# Patient Record
Sex: Male | Born: 1970 | Race: White | Hispanic: No | Marital: Single | Smoking: Current every day smoker
Health system: Southern US, Community
[De-identification: ages and names within clinical notes are randomized; demographics above are authoritative.]

## PROBLEM LIST (undated history)

## (undated) DIAGNOSIS — F419 Anxiety disorder, unspecified: Secondary | ICD-10-CM

## (undated) DIAGNOSIS — R569 Unspecified convulsions: Secondary | ICD-10-CM

## (undated) DIAGNOSIS — T7840XA Allergy, unspecified, initial encounter: Secondary | ICD-10-CM

## (undated) DIAGNOSIS — F1911 Other psychoactive substance abuse, in remission: Secondary | ICD-10-CM

## (undated) DIAGNOSIS — K219 Gastro-esophageal reflux disease without esophagitis: Secondary | ICD-10-CM

## (undated) DIAGNOSIS — Z8719 Personal history of other diseases of the digestive system: Secondary | ICD-10-CM

## (undated) DIAGNOSIS — B1920 Unspecified viral hepatitis C without hepatic coma: Secondary | ICD-10-CM

## (undated) DIAGNOSIS — J189 Pneumonia, unspecified organism: Secondary | ICD-10-CM

## (undated) DIAGNOSIS — C801 Malignant (primary) neoplasm, unspecified: Secondary | ICD-10-CM

## (undated) DIAGNOSIS — F1011 Alcohol abuse, in remission: Secondary | ICD-10-CM

## (undated) DIAGNOSIS — A749 Chlamydial infection, unspecified: Secondary | ICD-10-CM

## (undated) HISTORY — PX: HEMORRHOID SURGERY: SHX153

## (undated) HISTORY — DX: Other psychoactive substance abuse, in remission: F19.11

## (undated) HISTORY — PX: OTHER SURGICAL HISTORY: SHX169

## (undated) HISTORY — PX: JOINT REPLACEMENT: SHX530

## (undated) HISTORY — DX: Chlamydial infection, unspecified: A74.9

## (undated) HISTORY — DX: Unspecified viral hepatitis C without hepatic coma: B19.20

## (undated) HISTORY — DX: Allergy, unspecified, initial encounter: T78.40XA

## (undated) HISTORY — DX: Personal history of other diseases of the digestive system: Z87.19

## (undated) HISTORY — DX: Alcohol abuse, in remission: F10.11

---

## 2006-11-10 DIAGNOSIS — R569 Unspecified convulsions: Secondary | ICD-10-CM

## 2006-11-10 HISTORY — DX: Unspecified convulsions: R56.9

## 2010-11-10 HISTORY — PX: ANKLE ARTHROSCOPY W/ OPEN REPAIR: SHX1145

## 2014-02-06 ENCOUNTER — Ambulatory Visit: Payer: Self-pay | Admitting: Adult Health

## 2014-02-06 ENCOUNTER — Encounter: Payer: Self-pay | Admitting: Adult Health

## 2014-02-06 ENCOUNTER — Ambulatory Visit (INDEPENDENT_AMBULATORY_CARE_PROVIDER_SITE_OTHER): Payer: Self-pay | Admitting: Adult Health

## 2014-02-06 VITALS — BP 100/72 | HR 78 | Temp 97.9°F | Resp 14 | Ht 71.0 in | Wt 198.0 lb

## 2014-02-06 DIAGNOSIS — Z113 Encounter for screening for infections with a predominantly sexual mode of transmission: Secondary | ICD-10-CM | POA: Insufficient documentation

## 2014-02-06 DIAGNOSIS — F1011 Alcohol abuse, in remission: Secondary | ICD-10-CM | POA: Insufficient documentation

## 2014-02-06 LAB — HEPATIC FUNCTION PANEL
ALK PHOS: 52 U/L (ref 39–117)
ALT: 16 U/L (ref 0–53)
AST: 26 U/L (ref 0–37)
Albumin: 4.3 g/dL (ref 3.5–5.2)
Bilirubin, Direct: 0.1 mg/dL (ref 0.0–0.3)
Total Bilirubin: 0.9 mg/dL (ref 0.3–1.2)
Total Protein: 7.4 g/dL (ref 6.0–8.3)

## 2014-02-06 NOTE — Patient Instructions (Signed)
   Thank you for choosing [place] [facility] for your health care needs.  Please have your labs drawn prior to leaving the office.  The results will be available through MyChart for your convenience. Please remember to activate this. The activation code is located at the end of this form.  You will need to schedule an appointment to have a complete physical exam where we will do additional blood work.

## 2014-02-06 NOTE — Progress Notes (Signed)
Pre visit review using our clinic review tool, if applicable. No additional management support is needed unless otherwise documented below in the visit note. 

## 2014-02-06 NOTE — Progress Notes (Signed)
Patient ID: Carl Morton, male   DOB: 1971/07/31, 43 y.o.   MRN: [id number]    Subjective:    Patient ID: Jaquise Morton, male    DOB: [birth year], 43 y.o.   MRN: [id number]  HPI  Pt is a pleasant 43 y/o male who presents to clinic to establish care. He is requesting STD testing. He also reports that he would like testing for Hepatitis C. He was homeless for 13-14 years. He has been in rehab for drugs and alcohol in 2011. He reports being clean since then. Pt reports that he was supposed to have his liver function drawn; however, he forgot to go have this drawn. We will check this for him here today.   Past Medical History  Diagnosis Date  . History of hemorrhoids   . Allergy   . Chlamydia   . Hepatitis C   . History of alcohol abuse   . History of drug abuse      Past Surgical History  Procedure Laterality Date  . Hemorrhoid surgery    . Ankle arthroscopy w/ open repair  2012  . Tear duct reconstruction Bilateral     Adolescent     Family History  Problem Relation Age of Onset  . Heart disease Father     CAD     History   Social History  . Marital Status: Single    Spouse Name: N/A    Number of Children: 0  . Years of Education: 9   Occupational History  . Landscaping    Social History Main Topics  . Smoking status: Current Every Day Smoker -- 0.25 packs/day for 34 years    Types: Cigarettes  . Smokeless tobacco: Not on file     Comment: 34 yrs   . Alcohol Use: No  . Drug Use: No  . Sexual Activity: Not on file   Other Topics Concern  . Not on file   Social History Narrative   Carl Morton grew up in [place]. He dropped out of [employer] in the 10th grade. He was homeless for 13-14 years. He was in rehab for drugs and alcohol in 2011. He has been clean since. He works in Biomedical scientist.      Review of Systems  Constitutional: Negative.   HENT: Negative.   Eyes: Negative.   Respiratory: Negative.   Cardiovascular: Negative.   Gastrointestinal:  Negative.   Endocrine: Negative.   Genitourinary: Negative.   Musculoskeletal: Negative.   Skin: Negative.   Allergic/Immunologic: Negative.   Neurological: Negative.   Hematological: Negative.   Psychiatric/Behavioral: Negative.        Objective:  BP 100/72  Pulse 78  Temp(Src) 97.9 F (36.6 C) (Oral)  Resp 14  Ht 5\' 11"  (1.803 m)  Wt 198 lb (89.812 kg)  BMI 27.63 kg/m2  SpO2 98%   Physical Exam  Constitutional: He is oriented to person, place, and time. He appears well-developed and well-nourished. No distress.  HENT:  Head: Normocephalic and atraumatic.  Eyes: Conjunctivae and EOM are normal.  Neck: Normal range of motion. Neck supple. No tracheal deviation present.  Cardiovascular: Normal rate, regular rhythm, normal heart sounds and intact distal pulses.  Exam reveals no gallop and no friction rub.   No murmur heard. Pulmonary/Chest: Effort normal and breath sounds normal. No respiratory distress. He has no wheezes. He has no rales.  Abdominal: Soft. Bowel sounds are normal. He exhibits no distension and no mass. There is no tenderness. There is no rebound and  no guarding.  Musculoskeletal: Normal range of motion.  Neurological: He is alert and oriented to person, place, and time. He has normal reflexes. No cranial nerve deficit. Coordination normal.  Skin: Skin is warm and dry.  Psychiatric: He has a normal mood and affect. His behavior is normal. Judgment and thought content normal.       Assessment & Plan:   1. Screen for STD (sexually transmitted disease) Pt has a hx of being homeless for 13-14 years, ETOH, drug abuse. Rehab in 2011 and clean since. Requesting STD testing. - GC/chlamydia probe amp, urine - RPR - HIV antibody - HSV(herpes simplex vrs) 1+2 ab-IgG - Hepatitis B surface antigen - Hepatitis B surface antibody - Hepatitis B core antibody, total - Hepatitis C antibody - Hepatitis A antibody, IgM  2. H/O ETOH abuse Will check hepatic panel  since hx of ETOH for many years. - Hepatic function panel

## 2014-02-07 ENCOUNTER — Other Ambulatory Visit: Payer: Self-pay | Admitting: Adult Health

## 2014-02-07 DIAGNOSIS — R768 Other specified abnormal immunological findings in serum: Secondary | ICD-10-CM

## 2014-02-07 LAB — HEPATITIS B CORE ANTIBODY, TOTAL: Hep B Core Total Ab: NONREACTIVE

## 2014-02-07 LAB — HEPATITIS B SURFACE ANTIBODY,QUALITATIVE: HEP B S AB: NEGATIVE

## 2014-02-07 LAB — HSV(HERPES SIMPLEX VRS) I + II AB-IGG
HSV 1 GLYCOPROTEIN G AB, IGG: 9 IV — AB
HSV 2 Glycoprotein G Ab, IgG: 0.32 IV

## 2014-02-07 LAB — HEPATITIS A ANTIBODY, IGM: HEP A IGM: NONREACTIVE

## 2014-02-07 LAB — HIV ANTIBODY (ROUTINE TESTING W REFLEX): HIV: NONREACTIVE

## 2014-02-07 LAB — GC/CHLAMYDIA PROBE AMP, URINE
Chlamydia, Swab/Urine, PCR: NEGATIVE
GC Probe Amp, Urine: NEGATIVE

## 2014-02-07 LAB — RPR

## 2014-02-07 LAB — HEPATITIS C ANTIBODY: HCV Ab: REACTIVE — AB

## 2014-02-07 LAB — HEPATITIS B SURFACE ANTIGEN: Hepatitis B Surface Ag: NEGATIVE

## 2014-12-21 ENCOUNTER — Ambulatory Visit: Payer: Self-pay

## 2015-09-03 ENCOUNTER — Emergency Department: Payer: Self-pay

## 2015-09-03 ENCOUNTER — Emergency Department
Admission: EM | Admit: 2015-09-03 | Discharge: 2015-09-03 | Disposition: A | Discharge status: Home / Self Care | Payer: Self-pay | Attending: Internal Medicine | Admitting: Internal Medicine

## 2015-09-03 ENCOUNTER — Encounter: Payer: Self-pay | Admitting: Emergency Medicine

## 2015-09-03 ENCOUNTER — Other Ambulatory Visit: Payer: Self-pay

## 2015-09-03 DIAGNOSIS — R0602 Shortness of breath: Secondary | ICD-10-CM | POA: Insufficient documentation

## 2015-09-03 DIAGNOSIS — R079 Chest pain, unspecified: Secondary | ICD-10-CM | POA: Insufficient documentation

## 2015-09-03 DIAGNOSIS — R61 Generalized hyperhidrosis: Secondary | ICD-10-CM | POA: Insufficient documentation

## 2015-09-03 DIAGNOSIS — Z720 Tobacco use: Secondary | ICD-10-CM | POA: Insufficient documentation

## 2015-09-03 DIAGNOSIS — R112 Nausea with vomiting, unspecified: Secondary | ICD-10-CM | POA: Insufficient documentation

## 2015-09-03 LAB — COMPREHENSIVE METABOLIC PANEL
ALT: 19 U/L (ref 17–63)
AST: 20 U/L (ref 15–41)
Albumin: 4.3 g/dL (ref 3.5–5.0)
Alkaline Phosphatase: 44 U/L (ref 38–126)
Anion gap: 9 (ref 5–15)
BILIRUBIN TOTAL: 1.1 mg/dL (ref 0.3–1.2)
BUN: 15 mg/dL (ref 6–20)
CO2: 28 mmol/L (ref 22–32)
Calcium: 9.6 mg/dL (ref 8.9–10.3)
Chloride: 103 mmol/L (ref 101–111)
Creatinine, Ser: 1.2 mg/dL (ref 0.61–1.24)
GFR calc Af Amer: >60 mL/min (ref >60)
GFR calc non Af Amer: >60 mL/min (ref >60)
GLUCOSE: 114 mg/dL — AB (ref 65–99)
POTASSIUM: 3.5 mmol/L (ref 3.5–5.1)
Sodium: 140 mmol/L (ref 135–145)
TOTAL PROTEIN: 7.8 g/dL (ref 6.5–8.1)

## 2015-09-03 LAB — CBC
HCT: 48.6 % (ref 40.0–52.0)
Hemoglobin: 16.6 g/dL (ref 13.0–18.0)
MCH: 32.1 pg (ref 26.0–34.0)
MCHC: 34 g/dL (ref 32.0–36.0)
MCV: 94.3 fL (ref 80.0–100.0)
PLATELETS: 222 10*3/uL (ref 150–440)
RBC: 5.16 MIL/uL (ref 4.40–5.90)
RDW: 12.7 % (ref 11.5–14.5)
WBC: 7 10*3/uL (ref 3.8–10.6)

## 2015-09-03 LAB — TROPONIN I: Troponin I: <0.03 ng/mL (ref <0.031)

## 2015-09-03 NOTE — ED Notes (Signed)
Patient from home via [facility] with c/o right sided chest pain since 1030am with nausea, vomiting, shortness of breath, and diaphoresis. Patient states under exertion his heart feels like it will "beat plum out of my chest".

## 2015-09-03 NOTE — ED Provider Notes (Signed)
[facility] Emergency Department Provider Note     Time seen: ----------------------------------------- 11:56 AM on 09/03/2015 -----------------------------------------    I have reviewed the triage vital signs and the nursing notes.   HISTORY  Chief Complaint No chief complaint on file.    HPI Carl Morton is a 44 y.o. male who presents to ER with right-sided chest pain since 10:30 AM with nausea vomiting shortness of breath and diaphoresis. Patient states under exertion his heart feels like it will be out of his chest and he feels it radiate into his head. Patient denies a history of this before. Nothing makes his symptoms better.   Past Medical History  Diagnosis Date  . History of hemorrhoids   . Allergy   . Chlamydia   . Hepatitis C   . History of alcohol abuse   . History of drug abuse     Patient Active Problem List   Diagnosis Date Noted  . Screen for STD (sexually transmitted disease) 02/06/2014  . H/O ETOH abuse 02/06/2014    Past Surgical History  Procedure Laterality Date  . Hemorrhoid surgery    . Ankle arthroscopy w/ open repair  2012  . Tear duct reconstruction Bilateral     Adolescent    Allergies Haldol  Social History Social History  Substance Use Topics  . Smoking status: Current Every Day Smoker -- 0.25 packs/day for 34 years    Types: Cigarettes  . Smokeless tobacco: Not on file     Comment: 34 yrs   . Alcohol Use: No    Review of Systems Constitutional: Negative for fever. Eyes: Negative for visual changes. ENT: Negative for sore throat. Cardiovascular: Positive for chest pain Respiratory: Positive shortness of breath Gastrointestinal: Negative for abdominal pain, positive for nausea vomiting Genitourinary: Negative for dysuria. Musculoskeletal: Negative for back pain. Skin: Positive for sweats Neurological: Negative for headaches, focal weakness or numbness.  10-point ROS otherwise  negative.  ____________________________________________   PHYSICAL EXAM:  VITAL SIGNS: ED Triage Vitals  Enc Vitals Group     BP --      Pulse --      Resp --      Temp --      Temp src --      SpO2 --      Weight --      Height --      Head Cir --      Peak Flow --      Pain Score --      Pain Loc --      Pain Edu? --      Excl. in [place]? --     Constitutional: Alert and oriented. Well appearing and in no distress. Eyes: Conjunctivae are normal. PERRL. Normal extraocular movements. ENT   Head: Normocephalic and atraumatic.   Nose: No congestion/rhinnorhea.   Mouth/Throat: Mucous membranes are moist.   Neck: No stridor. Cardiovascular: Normal rate, regular rhythm. Normal and symmetric distal pulses are present in all extremities. No murmurs, rubs, or gallops. Respiratory: Normal respiratory effort without tachypnea nor retractions. Breath sounds are clear and equal bilaterally. No wheezes/rales/rhonchi. Gastrointestinal: Soft and nontender. No distention. No abdominal bruits.  Musculoskeletal: Nontender with normal range of motion in all extremities. No joint effusions.  No lower extremity tenderness nor edema. Neurologic:  Normal speech and language. No gross focal neurologic deficits are appreciated. Speech is normal. No gait instability. Skin:  Skin is warm, dry and intact. No rash noted. Psychiatric: Mood and  affect are normal. Speech and behavior are normal. Patient exhibits appropriate insight and judgment. ____________________________________________  EKG: Interpreted by me. Normal sinus rhythm with a rate of 79 bpm, normal PR interval, normal QRS with, normal QT interval.  ____________________________________________  ED COURSE:  Pertinent labs & imaging results that were available during my care of the patient were reviewed by me and considered in my medical decision making (see chart for details). Patient is in no acute distress, will check labs and  reevaluate. ____________________________________________    LABS (pertinent positives/negatives)  Labs Reviewed  COMPREHENSIVE METABOLIC PANEL - Abnormal; Notable for the following:    Glucose, Bld 114 (*)    All other components within normal limits  CBC  TROPONIN I  TROPONIN I  URINE DRUG SCREEN, QUALITATIVE ([facility] ONLY)    RADIOLOGY Chest x-ray Is unremarkable ____________________________________________  FINAL ASSESSMENT AND PLAN  Chest pain  Plan: Patient with labs and imaging as dictated above. No clear cause for her symptoms. Patient is low risk according to heart score. Will be referred to cardiology for outpatient follow-up and likely stress testing or Holter monitoring.   Earleen Newport, MD   Earleen Newport, MD 09/03/15 [phone]

## 2015-09-03 NOTE — Discharge Instructions (Signed)
Nonspecific Chest Pain  °Chest pain can be caused by many different conditions. There is always a chance that your pain could be related to something serious, such as a heart attack or a blood clot in your lungs. Chest pain can also be caused by conditions that are not life-threatening. If you have chest pain, it is very important to follow up with your health care provider. °CAUSES  °Chest pain can be caused by: °· Heartburn. °· Pneumonia or bronchitis. °· Anxiety or stress. °· Inflammation around your heart (pericarditis) or lung (pleuritis or pleurisy). °· A blood clot in your lung. °· A collapsed lung (pneumothorax). It can develop suddenly on its own (spontaneous pneumothorax) or from trauma to the chest. °· Shingles infection (varicella-zoster virus). °· Heart attack. °· Damage to the bones, muscles, and cartilage that make up your chest wall. This can include: °¨ Bruised bones due to injury. °¨ Strained muscles or cartilage due to frequent or repeated coughing or overwork. °¨ Fracture to one or more ribs. °¨ Sore cartilage due to inflammation (costochondritis). °RISK FACTORS  °Risk factors for chest pain may include: °· Activities that increase your risk for trauma or injury to your chest. °· Respiratory infections or conditions that cause frequent coughing. °· Medical conditions or overeating that can cause heartburn. °· Heart disease or family history of heart disease. °· Conditions or health behaviors that increase your risk of developing a blood clot. °· Having had chicken pox (varicella zoster). °SIGNS AND SYMPTOMS °Chest pain can feel like: °· Burning or tingling on the surface of your chest or deep in your chest. °· Crushing, pressure, aching, or squeezing pain. °· Dull or sharp pain that is worse when you move, cough, or take a deep breath. °· Pain that is also felt in your back, neck, shoulder, or arm, or pain that spreads to any of these areas. °Your chest pain may come and go, or it may stay  constant. °DIAGNOSIS °Lab tests or other studies may be needed to find the cause of your pain. Your health care provider may have you take a test called an ambulatory ECG (electrocardiogram). An ECG records your heartbeat patterns at the time the test is performed. You may also have other tests, such as: °· Transthoracic echocardiogram (TTE). During echocardiography, sound waves are used to create a picture of all of the heart structures and to look at how blood flows through your heart. °· Transesophageal echocardiogram (TEE). This is a more advanced imaging test that obtains images from inside your body. It allows your health care provider to see your heart in finer detail. °· Cardiac monitoring. This allows your health care provider to monitor your heart rate and rhythm in real time. °· Holter monitor. This is a portable device that records your heartbeat and can help to diagnose abnormal heartbeats. It allows your health care provider to track your heart activity for several days, if needed. °· Stress tests. These can be done through exercise or by taking medicine that makes your heart beat more quickly. °· Blood tests. °· Imaging tests. °TREATMENT  °Your treatment depends on what is causing your chest pain. Treatment may include: °· Medicines. These may include: °¨ Acid blockers for heartburn. °¨ Anti-inflammatory medicine. °¨ Pain medicine for inflammatory conditions. °¨ Antibiotic medicine, if an infection is present. °¨ Medicines to dissolve blood clots. °¨ Medicines to treat coronary artery disease. °· Supportive care for conditions that do not require medicines. This may include: °¨ Resting. °¨ Applying heat   or cold packs to injured areas. °¨ Limiting activities until pain decreases. °HOME CARE INSTRUCTIONS °· If you were prescribed an antibiotic medicine, finish it all even if you start to feel better. °· Avoid any activities that bring on chest pain. °· Do not use any tobacco products, including  cigarettes, chewing tobacco, or electronic cigarettes. If you need help quitting, ask your health care provider. °· Do not drink alcohol. °· Take medicines only as directed by your health care provider. °· Keep all follow-up visits as directed by your health care provider. This is important. This includes any further testing if your chest pain does not go away. °· If heartburn is the cause for your chest pain, you may be told to keep your head raised (elevated) while sleeping. This reduces the chance that acid will go from your stomach into your esophagus. °· Make lifestyle changes as directed by your health care provider. These may include: °¨ Getting regular exercise. Ask your health care provider to suggest some activities that are safe for you. °¨ Eating a heart-healthy diet. A registered dietitian can help you to learn healthy eating options. °¨ Maintaining a healthy weight. °¨ Managing diabetes, if necessary. °¨ Reducing stress. °SEEK MEDICAL CARE IF: °· Your chest pain does not go away after treatment. °· You have a rash with blisters on your chest. °· You have a fever. °SEEK IMMEDIATE MEDICAL CARE IF:  °· Your chest pain is worse. °· You have an increasing cough, or you cough up blood. °· You have severe abdominal pain. °· You have severe weakness. °· You faint. °· You have chills. °· You have sudden, unexplained chest discomfort. °· You have sudden, unexplained discomfort in your arms, back, neck, or jaw. °· You have shortness of breath at any time. °· You suddenly start to sweat, or your skin gets clammy. °· You feel nauseous or you vomit. °· You suddenly feel light-headed or dizzy. °· Your heart begins to beat quickly, or it feels like it is skipping beats. °These symptoms may represent a serious problem that is an emergency. Do not wait to see if the symptoms will go away. Get medical help right away. Call your local emergency services (911 in the [place] Do not drive yourself to the hospital. °  °This  information is not intended to replace advice given to you by your health care provider. Make sure you discuss any questions you have with your health care provider. °  °Document Released: 08/06/2005 Document Revised: 11/17/2014 Document Reviewed: 06/02/2014 °Elsevier Interactive Patient Education ©2016 [employer] ° °

## 2018-01-04 ENCOUNTER — Other Ambulatory Visit: Payer: Self-pay | Admitting: Student

## 2018-01-04 DIAGNOSIS — R1013 Epigastric pain: Secondary | ICD-10-CM

## 2018-01-04 DIAGNOSIS — R1310 Dysphagia, unspecified: Secondary | ICD-10-CM

## 2018-01-12 ENCOUNTER — Ambulatory Visit
Admission: RE | Admit: 2018-01-12 | Discharge: 2018-01-12 | Disposition: A | Discharge status: Home / Self Care | Payer: Medicaid Other | Source: Ambulatory Visit | Attending: Student | Admitting: Student

## 2018-01-12 DIAGNOSIS — K219 Gastro-esophageal reflux disease without esophagitis: Secondary | ICD-10-CM | POA: Insufficient documentation

## 2018-01-12 DIAGNOSIS — R1310 Dysphagia, unspecified: Secondary | ICD-10-CM | POA: Diagnosis present

## 2018-01-12 DIAGNOSIS — R1013 Epigastric pain: Secondary | ICD-10-CM

## 2018-09-02 ENCOUNTER — Inpatient Hospital Stay: Admission: RE | Admit: 2018-09-02 | Payer: Medicaid Other | Source: Ambulatory Visit

## 2018-09-02 HISTORY — DX: Gastro-esophageal reflux disease without esophagitis: K21.9

## 2018-09-03 ENCOUNTER — Other Ambulatory Visit: Payer: Self-pay

## 2018-09-03 ENCOUNTER — Encounter
Admission: RE | Admit: 2018-09-03 | Discharge: 2018-09-03 | Disposition: A | Discharge status: Home / Self Care | Payer: Medicaid Other | Source: Ambulatory Visit | Attending: Otolaryngology | Admitting: Otolaryngology

## 2018-09-03 HISTORY — DX: Pneumonia, unspecified organism: J18.9

## 2018-09-03 HISTORY — DX: Anxiety disorder, unspecified: F41.9

## 2018-09-03 HISTORY — DX: Unspecified convulsions: R56.9

## 2018-09-03 NOTE — Patient Instructions (Signed)
Your procedure is scheduled on: 09-09-18 THURSDAY Report to Same Day Surgery 2nd floor medical mall [facility] Entrance-take elevator on left to 2nd floor.  Check in with surgery information desk.) To find out your arrival time please call [phone] between 1PM - 3PM on 09-08-18 [facility]  Remember: Instructions that are not followed completely may result in serious medical risk, up to and including death, or upon the discretion of your surgeon and anesthesiologist your surgery may need to be rescheduled.    _x___ 1. Do not eat food after midnight the night before your procedure. NO GUM OR CANDY AFTER MIDNIGHT.  You may drink clear liquids up to 2 hours before you are scheduled to arrive at the hospital for your procedure.  Do not drink clear liquids within 2 hours of your scheduled arrival to the hospital.  Clear liquids include  --Water or Apple juice without pulp  --Clear carbohydrate beverage such as ClearFast or Gatorade  --Black Coffee or Clear Tea (No milk, no creamers, do not add anything to the coffee or Tea   ____Ensure clear carbohydrate drink on the way to the hospital for bariatric patients  ____Ensure clear carbohydrate drink 3 hours before surgery for Dr Dwyane Luo patients if physician instructed.    __x__ 2. No Alcohol for 24 hours before or after surgery.   __x__3. No Smoking or e-cigarettes for 24 prior to surgery.  Do not use any chewable tobacco products for at least 6 hour prior to surgery   ____  4. Bring all medications with you on the day of surgery if instructed.    __x__ 5. Notify your doctor if there is any change in your medical condition     (cold, fever, infections).    x___6. On the morning of surgery brush your teeth with toothpaste and water.  You may rinse your mouth with mouth wash if you wish.  Do not swallow any toothpaste or mouthwash.   Do not wear jewelry, make-up, hairpins, clips or nail polish.  Do not wear lotions, powders, or  perfumes. You may wear deodorant.  Do not shave 48 hours prior to surgery. Men may shave face and neck.  Do not bring valuables to the hospital.    [facility] is not responsible for any belongings or valuables.               Contacts, dentures or bridgework may not be worn into surgery.  Leave your suitcase in the car. After surgery it may be brought to your room.  For patients admitted to the hospital, discharge time is determined by your treatment team.  _  Patients discharged the day of surgery will not be allowed to drive home.  You will need someone to drive you home and stay with you the night of your procedure.    Please read over the following fact sheets that you were given:   [facility] Preparing for Surgery  _x___ TAKE THE FOLLOWING MEDICATION THE MORNING OF SURGERY WITH A SMALL SIP OF WATER. These include:  1. LORATADINE (CLARITIN)  2.  PRILOSEC (OMEPRAZOLE)  3. TAKE AN EXTRA PRILOSEC THE NIGHT BEFORE YOUR SURGERY  4.  5.  6.  ____Fleets enema or Magnesium Citrate as directed.   ____ Use CHG Soap or sage wipes as directed on instruction sheet   ____ Use inhalers on the day of surgery and bring to hospital day of surgery  ____ Stop Metformin and Janumet 2 days prior to surgery.  ____ Take 1/2 of usual insulin dose the night before surgery and none on the morning surgery.   ____ Follow recommendations from Cardiologist, Pulmonologist or PCP regarding stopping Aspirin, Coumadin, Plavix ,Eliquis, Effient, or Pradaxa, and Pletal.  X____Stop Anti-inflammatories such as Advil, Aleve, Ibuprofen, Motrin, Naproxen, Naprosyn, Goodies powders or aspirin products NOW-OK to take Tylenol   _x___ Stop supplements until after surgery-STOP MELATONIN NOW-MAY RESUME AFTER SURGERY   ____ Bring C-Pap to the hospital.

## 2018-09-08 ENCOUNTER — Encounter: Payer: Self-pay | Admitting: Anesthesiology

## 2018-09-08 ENCOUNTER — Encounter
Admission: RE | Admit: 2018-09-08 | Discharge: 2018-09-08 | Disposition: A | Discharge status: Home / Self Care | Payer: Medicaid Other | Source: Ambulatory Visit | Attending: Otolaryngology | Admitting: Otolaryngology

## 2018-09-08 DIAGNOSIS — Z01812 Encounter for preprocedural laboratory examination: Secondary | ICD-10-CM | POA: Diagnosis present

## 2018-09-08 LAB — COMPREHENSIVE METABOLIC PANEL
ALT: 24 U/L (ref 0–44)
ANION GAP: 7 (ref 5–15)
AST: 22 U/L (ref 15–41)
Albumin: 4.1 g/dL (ref 3.5–5.0)
Alkaline Phosphatase: 40 U/L (ref 38–126)
BUN: 18 mg/dL (ref 6–20)
CHLORIDE: 103 mmol/L (ref 98–111)
CO2: 27 mmol/L (ref 22–32)
Calcium: 9.1 mg/dL (ref 8.9–10.3)
Creatinine, Ser: 1.08 mg/dL (ref 0.61–1.24)
GFR calc Af Amer: >60 mL/min (ref >60)
GFR calc non Af Amer: >60 mL/min (ref >60)
GLUCOSE: 97 mg/dL (ref 70–99)
POTASSIUM: 4 mmol/L (ref 3.5–5.1)
Sodium: 137 mmol/L (ref 135–145)
Total Bilirubin: 0.8 mg/dL (ref 0.3–1.2)
Total Protein: 7.3 g/dL (ref 6.5–8.1)

## 2018-09-08 LAB — CBC
HCT: 44.1 % (ref 39.0–52.0)
Hemoglobin: 15.2 g/dL (ref 13.0–17.0)
MCH: 32.5 pg (ref 26.0–34.0)
MCHC: 34.5 g/dL (ref 30.0–36.0)
MCV: 94.2 fL (ref 80.0–100.0)
PLATELETS: 235 10*3/uL (ref 150–400)
RBC: 4.68 MIL/uL (ref 4.22–5.81)
RDW: 12.3 % (ref 11.5–15.5)
WBC: 5.8 10*3/uL (ref 4.0–10.5)
nRBC: 0 % (ref 0.0–0.2)

## 2018-09-08 NOTE — Pre-Procedure Instructions (Signed)
MEDICAL CLEARANCE ON CHART LOW RISK 

## 2018-09-09 ENCOUNTER — Encounter
Admission: RE | Disposition: A | Discharge status: Home / Self Care | Payer: Self-pay | Source: Ambulatory Visit | Attending: Otolaryngology

## 2018-09-09 ENCOUNTER — Encounter: Payer: Self-pay | Admitting: *Deleted

## 2018-09-09 ENCOUNTER — Ambulatory Visit
Admission: RE | Admit: 2018-09-09 | Discharge: 2018-09-09 | Disposition: A | Discharge status: Home / Self Care | Payer: Medicaid Other | Source: Ambulatory Visit | Attending: Otolaryngology | Admitting: Otolaryngology

## 2018-09-09 ENCOUNTER — Ambulatory Visit: Payer: Medicaid Other | Admitting: Anesthesiology

## 2018-09-09 ENCOUNTER — Other Ambulatory Visit: Payer: Self-pay

## 2018-09-09 DIAGNOSIS — Z79899 Other long term (current) drug therapy: Secondary | ICD-10-CM | POA: Insufficient documentation

## 2018-09-09 DIAGNOSIS — C028 Malignant neoplasm of overlapping sites of tongue: Secondary | ICD-10-CM | POA: Insufficient documentation

## 2018-09-09 DIAGNOSIS — N400 Enlarged prostate without lower urinary tract symptoms: Secondary | ICD-10-CM | POA: Diagnosis not present

## 2018-09-09 DIAGNOSIS — F17210 Nicotine dependence, cigarettes, uncomplicated: Secondary | ICD-10-CM | POA: Diagnosis not present

## 2018-09-09 DIAGNOSIS — B1920 Unspecified viral hepatitis C without hepatic coma: Secondary | ICD-10-CM | POA: Insufficient documentation

## 2018-09-09 DIAGNOSIS — J449 Chronic obstructive pulmonary disease, unspecified: Secondary | ICD-10-CM | POA: Insufficient documentation

## 2018-09-09 HISTORY — PX: MICROLARYNGOSCOPY: SHX5208

## 2018-09-09 HISTORY — PX: TONGUE BIOPSY: SHX6575

## 2018-09-09 LAB — URINE DRUG SCREEN, QUALITATIVE (ARMC ONLY)
Amphetamines, Ur Screen: NOT DETECTED
BARBITURATES, UR SCREEN: NOT DETECTED
Benzodiazepine, Ur Scrn: NOT DETECTED
CANNABINOID 50 NG, UR ~~LOC~~: POSITIVE — AB
Cocaine Metabolite,Ur ~~LOC~~: NOT DETECTED
MDMA (ECSTASY) UR SCREEN: NOT DETECTED
Methadone Scn, Ur: NOT DETECTED
Opiate, Ur Screen: NOT DETECTED
Phencyclidine (PCP) Ur S: NOT DETECTED
TRICYCLIC, UR SCREEN: NOT DETECTED

## 2018-09-09 SURGERY — BIOPSY, TONGUE
Anesthesia: General

## 2018-09-09 MED ORDER — ONDANSETRON HCL 4 MG/2ML IJ SOLN
INTRAMUSCULAR | Status: DC | PRN
  Administered 2018-09-09: 4 mg via INTRAVENOUS

## 2018-09-09 MED ORDER — HYDROCODONE-ACETAMINOPHEN 5-325 MG PO TABS
ORAL_TABLET | ORAL | Status: AC
  Filled 2018-09-09: qty 1

## 2018-09-09 MED ORDER — FENTANYL CITRATE (PF) 100 MCG/2ML IJ SOLN
INTRAMUSCULAR | Status: AC
  Filled 2018-09-09: qty 2

## 2018-09-09 MED ORDER — PROPOFOL 10 MG/ML IV BOLUS
INTRAVENOUS | Status: AC
  Filled 2018-09-09: qty 20

## 2018-09-09 MED ORDER — EPHEDRINE SULFATE 50 MG/ML IJ SOLN
INTRAMUSCULAR | Status: AC
  Filled 2018-09-09: qty 1

## 2018-09-09 MED ORDER — ONDANSETRON HCL 4 MG/2ML IJ SOLN
INTRAMUSCULAR | Status: AC
  Filled 2018-09-09: qty 2

## 2018-09-09 MED ORDER — FENTANYL CITRATE (PF) 100 MCG/2ML IJ SOLN
INTRAMUSCULAR | Status: DC | PRN
  Administered 2018-09-09: 50 ug via INTRAVENOUS

## 2018-09-09 MED ORDER — LIDOCAINE HCL (PF) 4 % IJ SOLN
INTRAMUSCULAR | Status: AC
  Filled 2018-09-09: qty 5

## 2018-09-09 MED ORDER — ROCURONIUM BROMIDE 100 MG/10ML IV SOLN
INTRAVENOUS | Status: DC | PRN
  Administered 2018-09-09: 20 mg via INTRAVENOUS

## 2018-09-09 MED ORDER — MIDAZOLAM HCL 2 MG/2ML IJ SOLN
INTRAMUSCULAR | Status: AC
  Filled 2018-09-09: qty 2

## 2018-09-09 MED ORDER — PROPOFOL 10 MG/ML IV BOLUS
INTRAVENOUS | Status: DC | PRN
  Administered 2018-09-09: 50 mg via INTRAVENOUS
  Administered 2018-09-09: 150 mg via INTRAVENOUS

## 2018-09-09 MED ORDER — HYDROCODONE-ACETAMINOPHEN 5-325 MG PO TABS: 1.0000 | ORAL_TABLET | ORAL | 0 refills | Status: DC | PRN

## 2018-09-09 MED ORDER — SUGAMMADEX SODIUM 200 MG/2ML IV SOLN
INTRAVENOUS | Status: DC | PRN
  Administered 2018-09-09: 100 mg via INTRAVENOUS

## 2018-09-09 MED ORDER — LACTATED RINGERS IV SOLN
INTRAVENOUS | Status: DC
  Administered 2018-09-09 (×2): via INTRAVENOUS

## 2018-09-09 MED ORDER — SUCCINYLCHOLINE CHLORIDE 20 MG/ML IJ SOLN
INTRAMUSCULAR | Status: DC | PRN
  Administered 2018-09-09: 80 mg via INTRAVENOUS

## 2018-09-09 MED ORDER — FENTANYL CITRATE (PF) 100 MCG/2ML IJ SOLN
25.0000 ug | INTRAMUSCULAR | Status: DC | PRN
  Administered 2018-09-09 (×2): 25 ug via INTRAVENOUS

## 2018-09-09 MED ORDER — OXYMETAZOLINE HCL 0.05 % NA SOLN
NASAL | Status: AC
  Filled 2018-09-09: qty 15

## 2018-09-09 MED ORDER — LIDOCAINE VISCOUS HCL 2 % MT SOLN: 10.0000 mL | Freq: Four times a day (QID) | OROMUCOSAL | 0 refills | Status: DC | PRN

## 2018-09-09 MED ORDER — DEXAMETHASONE SODIUM PHOSPHATE 10 MG/ML IJ SOLN
INTRAMUSCULAR | Status: DC | PRN
  Administered 2018-09-09: 10 mg via INTRAVENOUS

## 2018-09-09 MED ORDER — ONDANSETRON HCL 4 MG PO TABS: 4.0000 mg | ORAL_TABLET | Freq: Three times a day (TID) | ORAL | 0 refills | Status: DC | PRN

## 2018-09-09 MED ORDER — ONDANSETRON HCL 4 MG/2ML IJ SOLN: 4.0000 mg | Freq: Once | INTRAMUSCULAR | Status: DC | PRN

## 2018-09-09 MED ORDER — HYDROCODONE-ACETAMINOPHEN 5-325 MG PO TABS
1.0000 | ORAL_TABLET | ORAL | Status: DC | PRN
  Administered 2018-09-09: 1 via ORAL

## 2018-09-09 MED ORDER — SUGAMMADEX SODIUM 200 MG/2ML IV SOLN
INTRAVENOUS | Status: AC
  Filled 2018-09-09: qty 2

## 2018-09-09 MED ORDER — LIDOCAINE HCL (CARDIAC) PF 100 MG/5ML IV SOSY
PREFILLED_SYRINGE | INTRAVENOUS | Status: DC | PRN
  Administered 2018-09-09: 100 mg via INTRAVENOUS

## 2018-09-09 MED ORDER — LIDOCAINE-EPINEPHRINE 1 %-1:100000 IJ SOLN
INTRAMUSCULAR | Status: AC
  Filled 2018-09-09: qty 1

## 2018-09-09 SURGICAL SUPPLY — 29 items
BLADE SURG 15 STRL LF DISP TIS (BLADE) ×2 IMPLANT
BLADE SURG 15 STRL SS (BLADE) ×2
CANISTER SUCT 1200ML W/VALVE (MISCELLANEOUS) ×4 IMPLANT
COAGULATOR SUCT 8FR VV (MISCELLANEOUS) ×4 IMPLANT
COVER WAND RF STERILE (DRAPES) IMPLANT
CUP MEDICINE 2OZ PLAST GRAD ST (MISCELLANEOUS) ×4 IMPLANT
DRAPE TABLE BACK 80X90 (DRAPES) ×4 IMPLANT
DRSG TELFA 4X3 1S NADH ST (GAUZE/BANDAGES/DRESSINGS) ×4 IMPLANT
ELECT CAUTERY BLADE 6.4 (BLADE) ×4 IMPLANT
ELECT REM PT RETURN 9FT ADLT (ELECTROSURGICAL) ×4
ELECTRODE REM PT RTRN 9FT ADLT (ELECTROSURGICAL) ×2 IMPLANT
GAUZE 4X4 16PLY RFD (DISPOSABLE) ×4 IMPLANT
GLOVE BIO SURGEON STRL SZ7.5 (GLOVE) ×8 IMPLANT
GOWN STRL REUS W/ TWL LRG LVL3 (GOWN DISPOSABLE) ×4 IMPLANT
GOWN STRL REUS W/TWL LRG LVL3 (GOWN DISPOSABLE) ×4
KIT TURNOVER KIT A (KITS) ×4 IMPLANT
NDL SAFETY ECLIPSE 18X1.5 (NEEDLE) ×2 IMPLANT
NEEDLE HYPO 18GX1.5 SHARP (NEEDLE) ×2
NS IRRIG 500ML POUR BTL (IV SOLUTION) ×4 IMPLANT
PACK HEAD/NECK (MISCELLANEOUS) ×4 IMPLANT
PATTIES SURGICAL .5 X.5 (GAUZE/BANDAGES/DRESSINGS) ×4 IMPLANT
SOL ANTI-FOG 6CC FOG-OUT (MISCELLANEOUS) ×2 IMPLANT
SOL FOG-OUT ANTI-FOG 6CC (MISCELLANEOUS) ×2
SUCTION FRAZIER HANDLE 10FR (MISCELLANEOUS) ×2
SUCTION TUBE FRAZIER 10FR DISP (MISCELLANEOUS) ×2 IMPLANT
SYR 3ML LL SCALE MARK (SYRINGE) ×4 IMPLANT
TOWEL OR 17X26 4PK STRL BLUE (TOWEL DISPOSABLE) ×4 IMPLANT
TUBING CONNECTING 10 (TUBING) ×3 IMPLANT
TUBING CONNECTING 10' (TUBING) ×1

## 2018-09-09 NOTE — Anesthesia Postprocedure Evaluation (Signed)
Anesthesia Post Note  Patient: Carl Morton  Procedure(s) Performed: TONGUE BIOPSY (Left ) MICROLARYNGOSCOPY (N/A )  Patient location during evaluation: PACU Anesthesia Type: General Level of consciousness: awake and alert Pain management: pain level controlled Vital Signs Assessment: post-procedure vital signs reviewed and stable Respiratory status: spontaneous breathing, nonlabored ventilation, respiratory function stable and patient connected to nasal cannula oxygen Cardiovascular status: blood pressure returned to baseline and stable Postop Assessment: no apparent nausea or vomiting Anesthetic complications: no     Last Vitals:  Vitals:   09/09/18 1100 09/09/18 1123  BP: 136/86 (!) 142/74  Pulse: 71 78  Resp: 14 16  Temp: (!) 36.4 C   SpO2: 100% 100%    Last Pain:  Vitals:   09/09/18 1123  TempSrc:   PainSc: [place]

## 2018-09-09 NOTE — Discharge Instructions (Signed)

## 2018-09-09 NOTE — Anesthesia Post-op Follow-up Note (Cosign Needed)
Anesthesia QCDR form completed.        

## 2018-09-09 NOTE — Transfer of Care (Cosign Needed)
Immediate Anesthesia Transfer of Care Note  Patient: Carl Morton  Procedure(s) Performed: TONGUE BIOPSY (Left ) MICROLARYNGOSCOPY (N/A )  Patient Location: PACU  Anesthesia Type:General  Level of Consciousness: awake  Airway & Oxygen Therapy: Patient Spontanous Breathing  Post-op Assessment: Report given to RN  Post vital signs: stable  Last Vitals:  Vitals Value Taken Time  BP 123/83 09/09/2018 10:25 AM  Temp    Pulse 84 09/09/2018 10:28 AM  Resp 18 09/09/2018 10:28 AM  SpO2 98 % 09/09/2018 10:28 AM  Vitals shown include unvalidated device data.  Last Pain:  Vitals:   09/09/18 0807  TempSrc: Oral  PainSc: 7          Complications: No apparent anesthesia complications

## 2018-09-09 NOTE — Anesthesia Preprocedure Evaluation (Signed)
Anesthesia Evaluation  Patient identified by MRN, date of birth, ID band Patient awake    Reviewed: Allergy & Precautions, NPO status , Patient's Chart, lab work & pertinent test results, reviewed documented beta blocker date and time   Airway Mallampati: III  TM Distance: >3 FB     Dental  (+) Upper Dentures, Lower Dentures   Pulmonary pneumonia, resolved, Current Smoker,           Cardiovascular      Neuro/Psych Seizures -,  Anxiety    GI/Hepatic GERD  Controlled,(+) Hepatitis -, C  Endo/Other    Renal/GU      Musculoskeletal   Abdominal   Peds  Hematology   Anesthesia Other Findings Seizures during drinking days. ETOH hx. cxr OK. ekg OK from 3 yrs ago.  Reproductive/Obstetrics                             Anesthesia Physical Anesthesia Plan  ASA: III  Anesthesia Plan: General   Post-op Pain Management:    Induction: Intravenous  PONV Risk Score and Plan:   Airway Management Planned: Oral ETT  Additional Equipment:   Intra-op Plan:   Post-operative Plan:   Informed Consent: I have reviewed the patients History and Physical, chart, labs and discussed the procedure including the risks, benefits and alternatives for the proposed anesthesia with the patient or authorized representative who has indicated his/her understanding and acceptance.     Plan Discussed with: CRNA  Anesthesia Plan Comments:         Anesthesia Quick Evaluation

## 2018-09-09 NOTE — H&P (Signed)
..  History and Physical paper copy reviewed and updated date of procedure and will be scanned into system.  Patient seen and examined.  

## 2018-09-09 NOTE — Anesthesia Procedure Notes (Signed)
Procedure Name: Intubation Date/Time: 09/09/2018 9:36 AM Performed by: Gunnar Bulla, MD Pre-anesthesia Checklist: Patient identified, Emergency Drugs available, Suction available, Patient being monitored and Timeout performed Preoxygenation: Pre-oxygenation with 100% oxygen Induction Type: IV induction Ventilation: Mask ventilation without difficulty Laryngoscope Size: Mac and 4 Grade View: Grade I Tube type: Oral Tube size: 6.5 mm Number of attempts: 1 Airway Equipment and Method: Stylet Placement Confirmation: ETT inserted through vocal cords under direct vision,  positive ETCO2 and breath sounds checked- equal and bilateral Secured at: 21 cm Tube secured with: Tape

## 2018-09-09 NOTE — Op Note (Signed)
....  09/09/2018  10:10 AM    Carl Morton  [id number]   Pre-Op Dx:  Left mid tongue mass  Post-op Dx: same  Proc: 1)  Biopsy of left mid tongue mass  2)  Microlaryngoscopy   Surg:  [facility]  Anes:  GOT  EBL:  <82ml  Comp:  none  Findings:  2.5cm left tongue mass at lateral aspect at junction of anterior and posterior tongue.  Firm and ulcerative with multiple biopsies obtained.  No additional findings in patient's oropharynx/pharynx/larynx.  Procedure: After the patient was identified in holding and the history and physical and consent was reviewed, the patient was taken to the operating room and placed in a supine position. General endotracheal anesthesia was induced in the normal fashion.  At this time, the patient was rotated 90 degrees and a shoulder roll was placed as well as well as a gauze as the patient is edentulous   At this time,Dedo laryngoscope was inserted into the patient's oral cavity. Visualization of the oral cavity, oropharynx, pharynx and larynx was made.  This demonstrated a 2.5cm firm and ulcerative mass on the lateral aspect of the patient's left tongue at the junction of the anterior 2/3 and posterior 1/3. Topical afrin on pledgets were placed against the lesion for approximately one minute. At this time, multiple cup forcep biopsies were taken of the tongue mass.  Hemostasis was achieved with Bovie suction cautery.  No additional masses were seen.  The patient's entire airway was next evaluated for hemostasis and meticulous suctioning was performed.  The gauze and laryngoscope was removed from the patient's oral cavity without injury to teeth, lips, or gums.   Care of the patient was transferred to anesthesia.  Dispo:   To PACU in good condition  Plan:  Soft Diet, follow up in 1 week for repeat evaluation and review of pathology.  [name]  09/09/2018 10:10 AM

## 2018-09-10 LAB — SURGICAL PATHOLOGY

## 2018-09-13 NOTE — Addendum Note (Signed)
Addendum  created 09/13/18 1543 by Doreen Salvage, CRNA   Charge Capture section accepted

## 2018-09-14 ENCOUNTER — Other Ambulatory Visit: Payer: Self-pay | Admitting: Otolaryngology

## 2018-09-14 DIAGNOSIS — C029 Malignant neoplasm of tongue, unspecified: Secondary | ICD-10-CM

## 2018-09-24 ENCOUNTER — Ambulatory Visit: Payer: Medicaid Other

## 2018-09-30 ENCOUNTER — Ambulatory Visit
Admission: RE | Admit: 2018-09-30 | Discharge: 2018-09-30 | Disposition: A | Discharge status: Home / Self Care | Payer: Medicaid Other | Source: Ambulatory Visit | Attending: Otolaryngology | Admitting: Otolaryngology

## 2018-09-30 DIAGNOSIS — C029 Malignant neoplasm of tongue, unspecified: Secondary | ICD-10-CM | POA: Insufficient documentation

## 2018-09-30 HISTORY — DX: Malignant (primary) neoplasm, unspecified: C80.1

## 2018-09-30 MED ORDER — IOHEXOL 300 MG/ML  SOLN
75.0000 mL | Freq: Once | INTRAMUSCULAR | Status: AC | PRN
  Administered 2018-09-30: 75 mL via INTRAVENOUS

## 2019-01-06 ENCOUNTER — Encounter (INDEPENDENT_AMBULATORY_CARE_PROVIDER_SITE_OTHER): Payer: Self-pay

## 2019-01-06 ENCOUNTER — Ambulatory Visit
Admission: RE | Admit: 2019-01-06 | Discharge: 2019-01-06 | Disposition: A | Discharge status: Home / Self Care | Payer: Medicaid Other | Source: Ambulatory Visit | Attending: Radiation Oncology | Admitting: Radiation Oncology

## 2019-01-06 ENCOUNTER — Encounter: Payer: Self-pay | Admitting: Radiation Oncology

## 2019-01-06 ENCOUNTER — Other Ambulatory Visit: Payer: Self-pay

## 2019-01-06 ENCOUNTER — Other Ambulatory Visit: Payer: Self-pay | Admitting: *Deleted

## 2019-01-06 VITALS — BP 134/87 | HR 69 | Temp 97.4°F | Wt 176.1 lb

## 2019-01-06 DIAGNOSIS — F17210 Nicotine dependence, cigarettes, uncomplicated: Secondary | ICD-10-CM | POA: Insufficient documentation

## 2019-01-06 DIAGNOSIS — Z8781 Personal history of (healed) traumatic fracture: Secondary | ICD-10-CM | POA: Insufficient documentation

## 2019-01-06 DIAGNOSIS — F1011 Alcohol abuse, in remission: Secondary | ICD-10-CM | POA: Diagnosis not present

## 2019-01-06 DIAGNOSIS — Z79899 Other long term (current) drug therapy: Secondary | ICD-10-CM | POA: Insufficient documentation

## 2019-01-06 DIAGNOSIS — B1920 Unspecified viral hepatitis C without hepatic coma: Secondary | ICD-10-CM | POA: Insufficient documentation

## 2019-01-06 DIAGNOSIS — F419 Anxiety disorder, unspecified: Secondary | ICD-10-CM | POA: Diagnosis not present

## 2019-01-06 DIAGNOSIS — R590 Localized enlarged lymph nodes: Secondary | ICD-10-CM | POA: Diagnosis not present

## 2019-01-06 DIAGNOSIS — K219 Gastro-esophageal reflux disease without esophagitis: Secondary | ICD-10-CM | POA: Insufficient documentation

## 2019-01-06 DIAGNOSIS — C109 Malignant neoplasm of oropharynx, unspecified: Secondary | ICD-10-CM | POA: Insufficient documentation

## 2019-01-06 DIAGNOSIS — G40909 Epilepsy, unspecified, not intractable, without status epilepticus: Secondary | ICD-10-CM | POA: Diagnosis not present

## 2019-01-06 NOTE — Consult Note (Signed)
NEW PATIENT EVALUATION  Name: Carl Morton  MRN: [id number]  Date:   01/06/2019     DOB: 24-Jun-1971   This 48 y.o. 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  REFERRING PHYSICIAN: Remi Haggard, FNP  CHIEF COMPLAINT:  Chief Complaint  Patient presents with  . 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   PREVIOUS INVESTIGATIONS:  PET CT scan orderedPathology report reviewed Clinical records reviewed  HPI: patient is a 48 year old male who presented with 2-3 month history of oral pain after undergoing a teeth extraction. Patient has smoking history also history of alcohol abuse although his been sober since 2010. He was seen by ENT underwent a biopsy which was positive for squamous cell carcinoma. He underwent a transoral resection showing invasive moderately differentiated keratinizing squamous cell carcinoma. Deep margin was negative for high-grade dysplasia or carcinoma. One left level III lymph node was positive for a 1.0 cm metastatic focus negative for extracapsular extension. In total 31 lymph nodes were removed. Recommendation was made for adjuvant radiation therapy and he is seen today for evaluation. Patient had no initial PET CT scan as part of his workup. CT scan initially showed a subtle region of enhancement in the left lateral tongue measuring 1.8 cm in greatest dimension. There are asymmetrical and prominent and numerous left-sided subcentimeter cervical lymph nodes which were indeterminate. Patient has been recommended recommend it for postoperative radiation therapy is seen today for evaluation he is doing fairly well he's having no specific head and neck pain or dysphagia at this time.  PLANNED TREATMENT REGIMEN: adjuvant radiation therapy  PAST MEDICAL HISTORY:  has a past  medical history of Allergy, Anxiety, Cancer ([place]), Chlamydia, GERD (gastroesophageal reflux disease), Hepatitis C, History of alcohol abuse, History of drug abuse ([place]), History of hemorrhoids, Pneumonia, and Seizure ([place]) (2008).    PAST SURGICAL HISTORY:  Past Surgical History:  Procedure Laterality Date  . ANKLE ARTHROSCOPY W/ OPEN REPAIR  2012  . HEMORRHOID SURGERY    . JOINT REPLACEMENT     hip  . MICROLARYNGOSCOPY N/A 09/09/2018   Procedure: MICROLARYNGOSCOPY;  Surgeon: Carloyn Manner, MD;  Location: [facility] ORS;  Service: ENT;  Laterality: N/A;  . TEAR DUCT RECONSTRUCTION Bilateral    Adolescent  . TONGUE BIOPSY Left 09/09/2018   Procedure: TONGUE BIOPSY;  Surgeon: Carloyn Manner, MD;  Location: [facility] ORS;  Service: ENT;  Laterality: Left;    FAMILY HISTORY: family history includes Heart disease in his father.  SOCIAL HISTORY:  reports that he has been smoking cigarettes. He has a 8.50 pack-year smoking history. He has never used smokeless tobacco. He reports previous drug use. Drugs: Heroin, "Crack" cocaine, Cocaine, and Marijuana. He reports that he does not drink alcohol.  ALLERGIES: Haldol [haloperidol lactate]  MEDICATIONS:  Current Outpatient Medications  Medication Sig Dispense Refill  . baclofen (LIORESAL) 10 MG tablet Take 10 mg by mouth as needed for muscle spasms.    . diphenhydrAMINE (BENADRYL ALLERGY) 25 MG tablet Take 200 mg by mouth as needed (pt takes 8 25 mg tab prn at bedtime).    Marland Kitchen HYDROcodone-acetaminophen (NORCO) 5-325 MG tablet Take 1 tablet by mouth every 4 (four) hours as needed for moderate pain. 40 tablet 0  . lidocaine (XYLOCAINE) 2 % solution Use as directed 10 mLs in the mouth or  throat every 6 (six) hours as needed for mouth pain (Swish and spit). 250 mL 0  . loratadine (CLARITIN) 10 MG tablet Take 10 mg by mouth every morning.    . Melatonin 10 MG TABS Take 1 tablet by mouth as needed.    . naproxen (NAPROSYN) 500 MG tablet Take 500 mg by mouth  as needed.    Marland Kitchen omeprazole (PRILOSEC) 40 MG capsule Take 40 mg by mouth every morning.    . ondansetron (ZOFRAN) 4 MG tablet Take 1 tablet (4 mg total) by mouth every 8 (eight) hours as needed for up to 10 doses for nausea or vomiting. 20 tablet 0  . sildenafil (REVATIO) 20 MG tablet Take 20 mg by mouth as needed.    . TraZODone HCl 150 MG TB24 Take 1 tablet by mouth as needed.     No current facility-administered medications for this encounter.     ECOG PERFORMANCE STATUS:  0 - Asymptomatic  REVIEW OF SYSTEMS: patient is hepatitis C past history of alcohol abuse Patient denies any weight loss, fatigue, weakness, fever, chills or night sweats. Patient denies any loss of vision, blurred vision. Patient denies any ringing  of the ears or hearing loss. No irregular heartbeat. Patient denies heart murmur or history of fainting. Patient denies any chest pain or pain radiating to her upper extremities. Patient denies any shortness of breath, difficulty breathing at night, cough or hemoptysis. Patient denies any swelling in the lower legs. Patient denies any nausea vomiting, vomiting of blood, or coffee ground material in the vomitus. Patient denies any stomach pain. Patient states has had normal bowel movements no significant constipation or diarrhea. Patient denies any dysuria, hematuria or significant nocturia. Patient denies any problems walking, swelling in the joints or loss of balance. Patient denies any skin changes, loss of hair or loss of weight. Patient denies any excessive worrying or anxiety or significant depression. Patient denies any problems with insomnia. Patient denies excessive thirst, polyuria, polydipsia. Patient denies any swollen glands, patient denies easy bruising or easy bleeding. Patient denies any recent infections, allergies or URI. Patient "s visual fields have not changed significantly in recent time.    PHYSICAL EXAM: BP 134/87   Pulse 69   Temp (!) 97.4 F (36.3 C)   Wt  176 lb 2.4 oz (79.9 kg)   BMI 25.27 kg/m  Oral cavity showssome granulation tissue in the posterior left oropharynx. No other oral mucosal lesions are identified indirect mirror examination shows upper airway clear vallecula and base of tongue appeared normal. 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 No discernible adenopathy in the cervical or supra clavicular region is appreciated.Well-developed well-nourished patient in NAD. HEENT reveals PERLA, EOMI, discs not visualized.  Oral cavity is clear. No oral mucosal lesions are identified. Neck is clear without evidence of cervical or supraclavicular adenopathy. Lungs are clear to A&P. Cardiac examination is essentially unremarkable with regular rate and rhythm without murmur rub or thrill. Abdomen is benign with no organomegaly or masses noted. Motor sensory and DTR levels are equal and symmetric in the upper and lower extremities. Cranial nerves II through XII are grossly intact. Proprioception is intact. No peripheral adenopathy or edema is identified. No motor or sensory levels are noted. Crude visual fields are within normal range.  LABORATORY DATA: pathology reports reviewed    RADIOLOGY RESULTS:CT scan report reviewed PET CT scan ordered   IMPRESSION: pathologic stage TI N1 M0 squamous cell carcinoma the base of tongue status  post transoral resection with positive neck node in 48 year old male  PLAN: at this time of ordered a PET CT scan just to determine if there is any other residual disease in his head and neck region. I would plan on delivering 5000 cGy over 5 weeks to his head and neck region using IM RT treatment planning and delivery. I would choose IM RT treatment planning and delivery to spare critical structures such as a salivary glands spinal cord orbits.Risks and benefits of treatment including possible radiation esophagitis oral mucositis fatigue alteration of blood counts skin reaction all were described in detail to the  patient. He seems to comprehend my treatment plan well. I personally set up and ordered CT simulation after completion of his PET/CT scan which I will use for my treatment planning purposes. Patient and mother both comprehend my treatment plan well.  I would like to take this opportunity to thank you for allowing me to participate in the care of your patient.Noreene Filbert, MD

## 2019-01-10 ENCOUNTER — Ambulatory Visit: Payer: Medicaid Other

## 2019-01-13 ENCOUNTER — Ambulatory Visit: Payer: Medicaid Other

## 2019-01-13 ENCOUNTER — Telehealth: Payer: Self-pay | Admitting: *Deleted

## 2019-01-13 NOTE — Telephone Encounter (Signed)
Patient notified of appointments for PET scan and CT simulation with read back of appointment dates and times.

## 2019-01-18 ENCOUNTER — Ambulatory Visit: Payer: Medicaid Other

## 2019-01-19 ENCOUNTER — Ambulatory Visit: Payer: Medicaid Other

## 2019-01-20 ENCOUNTER — Ambulatory Visit
Admission: RE | Admit: 2019-01-20 | Discharge: 2019-01-20 | Disposition: A | Discharge status: Home / Self Care | Payer: Medicaid Other | Source: Ambulatory Visit | Attending: Radiation Oncology | Admitting: Radiation Oncology

## 2019-01-20 ENCOUNTER — Other Ambulatory Visit: Payer: Self-pay

## 2019-01-20 DIAGNOSIS — C109 Malignant neoplasm of oropharynx, unspecified: Secondary | ICD-10-CM | POA: Insufficient documentation

## 2019-01-20 DIAGNOSIS — I2510 Atherosclerotic heart disease of native coronary artery without angina pectoris: Secondary | ICD-10-CM | POA: Insufficient documentation

## 2019-01-20 DIAGNOSIS — J439 Emphysema, unspecified: Secondary | ICD-10-CM | POA: Insufficient documentation

## 2019-01-20 LAB — GLUCOSE, CAPILLARY: GLUCOSE-CAPILLARY: 89 mg/dL (ref 70–99)

## 2019-01-20 MED ORDER — FLUDEOXYGLUCOSE F - 18 (FDG) INJECTION
9.1000 | Freq: Once | INTRAVENOUS | Status: AC | PRN
  Administered 2019-01-20: 9.3 via INTRAVENOUS

## 2019-01-24 ENCOUNTER — Ambulatory Visit
Admission: RE | Admit: 2019-01-24 | Discharge: 2019-01-24 | Disposition: A | Discharge status: Home / Self Care | Payer: Medicaid Other | Source: Ambulatory Visit | Attending: Radiation Oncology | Admitting: Radiation Oncology

## 2019-01-24 ENCOUNTER — Other Ambulatory Visit: Payer: Self-pay

## 2019-01-24 DIAGNOSIS — Z79899 Other long term (current) drug therapy: Secondary | ICD-10-CM | POA: Diagnosis not present

## 2019-01-24 DIAGNOSIS — F1011 Alcohol abuse, in remission: Secondary | ICD-10-CM | POA: Insufficient documentation

## 2019-01-24 DIAGNOSIS — Z87891 Personal history of nicotine dependence: Secondary | ICD-10-CM | POA: Insufficient documentation

## 2019-01-24 DIAGNOSIS — K219 Gastro-esophageal reflux disease without esophagitis: Secondary | ICD-10-CM | POA: Insufficient documentation

## 2019-01-24 DIAGNOSIS — C109 Malignant neoplasm of oropharynx, unspecified: Secondary | ICD-10-CM | POA: Diagnosis not present

## 2019-01-24 DIAGNOSIS — Z888 Allergy status to other drugs, medicaments and biological substances status: Secondary | ICD-10-CM | POA: Diagnosis not present

## 2019-01-24 DIAGNOSIS — Z791 Long term (current) use of non-steroidal anti-inflammatories (NSAID): Secondary | ICD-10-CM | POA: Insufficient documentation

## 2019-01-26 DIAGNOSIS — C109 Malignant neoplasm of oropharynx, unspecified: Secondary | ICD-10-CM | POA: Diagnosis not present

## 2019-01-31 ENCOUNTER — Other Ambulatory Visit: Payer: Self-pay | Admitting: *Deleted

## 2019-01-31 DIAGNOSIS — C109 Malignant neoplasm of oropharynx, unspecified: Secondary | ICD-10-CM

## 2019-02-01 ENCOUNTER — Other Ambulatory Visit: Payer: Self-pay

## 2019-02-02 ENCOUNTER — Other Ambulatory Visit: Payer: Self-pay

## 2019-02-02 ENCOUNTER — Ambulatory Visit
Admission: RE | Admit: 2019-02-02 | Discharge: 2019-02-02 | Disposition: A | Discharge status: Home / Self Care | Payer: Medicaid Other | Source: Ambulatory Visit | Attending: Radiation Oncology | Admitting: Radiation Oncology

## 2019-02-03 ENCOUNTER — Other Ambulatory Visit: Payer: Self-pay

## 2019-02-03 ENCOUNTER — Ambulatory Visit
Admission: RE | Admit: 2019-02-03 | Discharge: 2019-02-03 | Disposition: A | Discharge status: Home / Self Care | Payer: Medicaid Other | Source: Ambulatory Visit | Attending: Radiation Oncology | Admitting: Radiation Oncology

## 2019-02-03 DIAGNOSIS — C109 Malignant neoplasm of oropharynx, unspecified: Secondary | ICD-10-CM | POA: Diagnosis not present

## 2019-02-04 ENCOUNTER — Ambulatory Visit
Admission: RE | Admit: 2019-02-04 | Discharge: 2019-02-04 | Disposition: A | Discharge status: Home / Self Care | Payer: Medicaid Other | Source: Ambulatory Visit | Attending: Radiation Oncology | Admitting: Radiation Oncology

## 2019-02-04 ENCOUNTER — Other Ambulatory Visit: Payer: Self-pay

## 2019-02-04 DIAGNOSIS — C109 Malignant neoplasm of oropharynx, unspecified: Secondary | ICD-10-CM | POA: Diagnosis not present

## 2019-02-07 ENCOUNTER — Other Ambulatory Visit: Payer: Self-pay

## 2019-02-07 ENCOUNTER — Ambulatory Visit
Admission: RE | Admit: 2019-02-07 | Discharge: 2019-02-07 | Disposition: A | Discharge status: Home / Self Care | Payer: Medicaid Other | Source: Ambulatory Visit | Attending: Radiation Oncology | Admitting: Radiation Oncology

## 2019-02-07 DIAGNOSIS — C109 Malignant neoplasm of oropharynx, unspecified: Secondary | ICD-10-CM | POA: Diagnosis not present

## 2019-02-08 ENCOUNTER — Other Ambulatory Visit: Payer: Self-pay

## 2019-02-08 ENCOUNTER — Ambulatory Visit
Admission: RE | Admit: 2019-02-08 | Discharge: 2019-02-08 | Disposition: A | Discharge status: Home / Self Care | Payer: Medicaid Other | Source: Ambulatory Visit | Attending: Radiation Oncology | Admitting: Radiation Oncology

## 2019-02-08 DIAGNOSIS — C109 Malignant neoplasm of oropharynx, unspecified: Secondary | ICD-10-CM | POA: Diagnosis not present

## 2019-02-09 ENCOUNTER — Ambulatory Visit
Admission: RE | Admit: 2019-02-09 | Discharge: 2019-02-09 | Disposition: A | Discharge status: Home / Self Care | Payer: Medicaid Other | Source: Ambulatory Visit | Attending: Radiation Oncology | Admitting: Radiation Oncology

## 2019-02-09 DIAGNOSIS — Z888 Allergy status to other drugs, medicaments and biological substances status: Secondary | ICD-10-CM | POA: Diagnosis not present

## 2019-02-09 DIAGNOSIS — Z79899 Other long term (current) drug therapy: Secondary | ICD-10-CM | POA: Diagnosis not present

## 2019-02-09 DIAGNOSIS — Z87891 Personal history of nicotine dependence: Secondary | ICD-10-CM | POA: Diagnosis not present

## 2019-02-09 DIAGNOSIS — F1011 Alcohol abuse, in remission: Secondary | ICD-10-CM | POA: Diagnosis not present

## 2019-02-09 DIAGNOSIS — Z791 Long term (current) use of non-steroidal anti-inflammatories (NSAID): Secondary | ICD-10-CM | POA: Diagnosis not present

## 2019-02-09 DIAGNOSIS — K219 Gastro-esophageal reflux disease without esophagitis: Secondary | ICD-10-CM | POA: Diagnosis not present

## 2019-02-09 DIAGNOSIS — C109 Malignant neoplasm of oropharynx, unspecified: Secondary | ICD-10-CM | POA: Insufficient documentation

## 2019-02-10 ENCOUNTER — Other Ambulatory Visit: Payer: Self-pay

## 2019-02-10 ENCOUNTER — Ambulatory Visit
Admission: RE | Admit: 2019-02-10 | Discharge: 2019-02-10 | Disposition: A | Discharge status: Home / Self Care | Payer: Medicaid Other | Source: Ambulatory Visit | Attending: Radiation Oncology | Admitting: Radiation Oncology

## 2019-02-10 DIAGNOSIS — C109 Malignant neoplasm of oropharynx, unspecified: Secondary | ICD-10-CM | POA: Diagnosis not present

## 2019-02-11 ENCOUNTER — Ambulatory Visit
Admission: RE | Admit: 2019-02-11 | Discharge: 2019-02-11 | Disposition: A | Discharge status: Home / Self Care | Payer: Medicaid Other | Source: Ambulatory Visit | Attending: Radiation Oncology | Admitting: Radiation Oncology

## 2019-02-11 ENCOUNTER — Other Ambulatory Visit: Payer: Self-pay

## 2019-02-11 DIAGNOSIS — C109 Malignant neoplasm of oropharynx, unspecified: Secondary | ICD-10-CM | POA: Diagnosis not present

## 2019-02-14 ENCOUNTER — Ambulatory Visit
Admission: RE | Admit: 2019-02-14 | Discharge: 2019-02-14 | Disposition: A | Discharge status: Home / Self Care | Payer: Medicaid Other | Source: Ambulatory Visit | Attending: Radiation Oncology | Admitting: Radiation Oncology

## 2019-02-14 ENCOUNTER — Other Ambulatory Visit: Payer: Self-pay

## 2019-02-14 DIAGNOSIS — C109 Malignant neoplasm of oropharynx, unspecified: Secondary | ICD-10-CM | POA: Diagnosis not present

## 2019-02-15 ENCOUNTER — Inpatient Hospital Stay: Payer: Medicaid Other | Attending: Radiation Oncology

## 2019-02-15 ENCOUNTER — Other Ambulatory Visit: Payer: Self-pay

## 2019-02-15 ENCOUNTER — Ambulatory Visit
Admission: RE | Admit: 2019-02-15 | Discharge: 2019-02-15 | Disposition: A | Discharge status: Home / Self Care | Payer: Medicaid Other | Source: Ambulatory Visit | Attending: Radiation Oncology | Admitting: Radiation Oncology

## 2019-02-15 DIAGNOSIS — Z79899 Other long term (current) drug therapy: Secondary | ICD-10-CM | POA: Insufficient documentation

## 2019-02-15 DIAGNOSIS — K219 Gastro-esophageal reflux disease without esophagitis: Secondary | ICD-10-CM | POA: Diagnosis not present

## 2019-02-15 DIAGNOSIS — C109 Malignant neoplasm of oropharynx, unspecified: Secondary | ICD-10-CM | POA: Diagnosis not present

## 2019-02-15 DIAGNOSIS — F1011 Alcohol abuse, in remission: Secondary | ICD-10-CM | POA: Insufficient documentation

## 2019-02-15 LAB — CBC
HCT: 44.2 % (ref 39.0–52.0)
Hemoglobin: 14.9 g/dL (ref 13.0–17.0)
MCH: 32.6 pg (ref 26.0–34.0)
MCHC: 33.7 g/dL (ref 30.0–36.0)
MCV: 96.7 fL (ref 80.0–100.0)
Platelets: 241 10*3/uL (ref 150–400)
RBC: 4.57 MIL/uL (ref 4.22–5.81)
RDW: 12 % (ref 11.5–15.5)
WBC: 4.2 10*3/uL (ref 4.0–10.5)
nRBC: 0 % (ref 0.0–0.2)

## 2019-02-16 ENCOUNTER — Ambulatory Visit
Admission: RE | Admit: 2019-02-16 | Discharge: 2019-02-16 | Disposition: A | Discharge status: Home / Self Care | Payer: Medicaid Other | Source: Ambulatory Visit | Attending: Radiation Oncology | Admitting: Radiation Oncology

## 2019-02-16 ENCOUNTER — Other Ambulatory Visit: Payer: Self-pay

## 2019-02-16 DIAGNOSIS — C109 Malignant neoplasm of oropharynx, unspecified: Secondary | ICD-10-CM | POA: Diagnosis not present

## 2019-02-17 ENCOUNTER — Other Ambulatory Visit: Payer: Self-pay

## 2019-02-17 ENCOUNTER — Ambulatory Visit
Admission: RE | Admit: 2019-02-17 | Discharge: 2019-02-17 | Disposition: A | Discharge status: Home / Self Care | Payer: Medicaid Other | Source: Ambulatory Visit | Attending: Radiation Oncology | Admitting: Radiation Oncology

## 2019-02-17 DIAGNOSIS — C109 Malignant neoplasm of oropharynx, unspecified: Secondary | ICD-10-CM | POA: Diagnosis not present

## 2019-02-18 ENCOUNTER — Other Ambulatory Visit: Payer: Self-pay

## 2019-02-18 ENCOUNTER — Ambulatory Visit
Admission: RE | Admit: 2019-02-18 | Discharge: 2019-02-18 | Disposition: A | Discharge status: Home / Self Care | Payer: Medicaid Other | Source: Ambulatory Visit | Attending: Radiation Oncology | Admitting: Radiation Oncology

## 2019-02-18 DIAGNOSIS — C109 Malignant neoplasm of oropharynx, unspecified: Secondary | ICD-10-CM | POA: Diagnosis not present

## 2019-02-21 ENCOUNTER — Ambulatory Visit
Admission: RE | Admit: 2019-02-21 | Discharge: 2019-02-21 | Disposition: A | Discharge status: Home / Self Care | Payer: Medicaid Other | Source: Ambulatory Visit | Attending: Radiation Oncology | Admitting: Radiation Oncology

## 2019-02-21 ENCOUNTER — Other Ambulatory Visit: Payer: Self-pay

## 2019-02-21 DIAGNOSIS — C109 Malignant neoplasm of oropharynx, unspecified: Secondary | ICD-10-CM | POA: Diagnosis not present

## 2019-02-22 ENCOUNTER — Inpatient Hospital Stay: Payer: Medicaid Other

## 2019-02-22 ENCOUNTER — Ambulatory Visit
Admission: RE | Admit: 2019-02-22 | Discharge: 2019-02-22 | Disposition: A | Discharge status: Home / Self Care | Payer: Medicaid Other | Source: Ambulatory Visit | Attending: Radiation Oncology | Admitting: Radiation Oncology

## 2019-02-22 ENCOUNTER — Other Ambulatory Visit: Payer: Self-pay

## 2019-02-22 ENCOUNTER — Other Ambulatory Visit: Payer: Self-pay | Admitting: *Deleted

## 2019-02-22 DIAGNOSIS — C109 Malignant neoplasm of oropharynx, unspecified: Secondary | ICD-10-CM

## 2019-02-22 LAB — CBC
HCT: 45.6 % (ref 39.0–52.0)
Hemoglobin: 15.7 g/dL (ref 13.0–17.0)
MCH: 32.2 pg (ref 26.0–34.0)
MCHC: 34.4 g/dL (ref 30.0–36.0)
MCV: 93.6 fL (ref 80.0–100.0)
Platelets: 225 10*3/uL (ref 150–400)
RBC: 4.87 MIL/uL (ref 4.22–5.81)
RDW: 11.8 % (ref 11.5–15.5)
WBC: 6 10*3/uL (ref 4.0–10.5)
nRBC: 0 % (ref 0.0–0.2)

## 2019-02-22 MED ORDER — SUCRALFATE 1 G PO TABS: 1.0000 g | ORAL_TABLET | Freq: Three times a day (TID) | ORAL | 5 refills | Status: DC

## 2019-02-23 ENCOUNTER — Other Ambulatory Visit: Payer: Self-pay

## 2019-02-23 ENCOUNTER — Ambulatory Visit
Admission: RE | Admit: 2019-02-23 | Discharge: 2019-02-23 | Disposition: A | Discharge status: Home / Self Care | Payer: Medicaid Other | Source: Ambulatory Visit | Attending: Radiation Oncology | Admitting: Radiation Oncology

## 2019-02-23 DIAGNOSIS — C109 Malignant neoplasm of oropharynx, unspecified: Secondary | ICD-10-CM | POA: Diagnosis not present

## 2019-02-24 ENCOUNTER — Other Ambulatory Visit: Payer: Self-pay

## 2019-02-24 ENCOUNTER — Ambulatory Visit: Admission: RE | Admit: 2019-02-24 | Payer: Medicaid Other | Source: Ambulatory Visit

## 2019-02-24 ENCOUNTER — Ambulatory Visit: Payer: Medicaid Other

## 2019-02-25 ENCOUNTER — Ambulatory Visit: Payer: Medicaid Other

## 2019-02-28 ENCOUNTER — Ambulatory Visit: Payer: Medicaid Other

## 2019-03-01 ENCOUNTER — Ambulatory Visit: Payer: Medicaid Other

## 2019-03-01 ENCOUNTER — Inpatient Hospital Stay: Payer: Medicaid Other

## 2019-03-02 ENCOUNTER — Ambulatory Visit: Payer: Medicaid Other

## 2019-03-03 ENCOUNTER — Ambulatory Visit: Payer: Medicaid Other

## 2019-03-04 ENCOUNTER — Ambulatory Visit: Payer: Medicaid Other

## 2019-03-07 ENCOUNTER — Other Ambulatory Visit: Payer: Self-pay

## 2019-03-07 ENCOUNTER — Ambulatory Visit
Admission: RE | Admit: 2019-03-07 | Discharge: 2019-03-07 | Disposition: A | Discharge status: Home / Self Care | Payer: Medicaid Other | Source: Ambulatory Visit | Attending: Radiation Oncology | Admitting: Radiation Oncology

## 2019-03-07 DIAGNOSIS — C109 Malignant neoplasm of oropharynx, unspecified: Secondary | ICD-10-CM | POA: Diagnosis not present

## 2019-03-08 ENCOUNTER — Inpatient Hospital Stay: Payer: Medicaid Other

## 2019-03-08 ENCOUNTER — Ambulatory Visit
Admission: RE | Admit: 2019-03-08 | Discharge: 2019-03-08 | Disposition: A | Discharge status: Home / Self Care | Payer: Medicaid Other | Source: Ambulatory Visit | Attending: Radiation Oncology | Admitting: Radiation Oncology

## 2019-03-08 ENCOUNTER — Other Ambulatory Visit: Payer: Self-pay

## 2019-03-08 DIAGNOSIS — C109 Malignant neoplasm of oropharynx, unspecified: Secondary | ICD-10-CM

## 2019-03-08 LAB — CBC
HCT: 46.4 % (ref 39.0–52.0)
Hemoglobin: 15.8 g/dL (ref 13.0–17.0)
MCH: 32 pg (ref 26.0–34.0)
MCHC: 34.1 g/dL (ref 30.0–36.0)
MCV: 94.1 fL (ref 80.0–100.0)
Platelets: 235 10*3/uL (ref 150–400)
RBC: 4.93 MIL/uL (ref 4.22–5.81)
RDW: 11.9 % (ref 11.5–15.5)
WBC: 4.2 10*3/uL (ref 4.0–10.5)
nRBC: 0 % (ref 0.0–0.2)

## 2019-03-09 ENCOUNTER — Other Ambulatory Visit: Payer: Self-pay

## 2019-03-09 ENCOUNTER — Ambulatory Visit: Payer: Medicaid Other

## 2019-03-09 ENCOUNTER — Ambulatory Visit
Admission: RE | Admit: 2019-03-09 | Discharge: 2019-03-09 | Disposition: A | Discharge status: Home / Self Care | Payer: Medicaid Other | Source: Ambulatory Visit | Attending: Radiation Oncology | Admitting: Radiation Oncology

## 2019-03-09 DIAGNOSIS — C109 Malignant neoplasm of oropharynx, unspecified: Secondary | ICD-10-CM | POA: Diagnosis not present

## 2019-03-10 ENCOUNTER — Ambulatory Visit: Payer: Medicaid Other

## 2019-03-11 ENCOUNTER — Other Ambulatory Visit: Payer: Self-pay

## 2019-03-11 ENCOUNTER — Ambulatory Visit
Admission: RE | Admit: 2019-03-11 | Discharge: 2019-03-11 | Disposition: A | Discharge status: Home / Self Care | Payer: Medicaid Other | Source: Ambulatory Visit | Attending: Radiation Oncology | Admitting: Radiation Oncology

## 2019-03-11 DIAGNOSIS — Z791 Long term (current) use of non-steroidal anti-inflammatories (NSAID): Secondary | ICD-10-CM | POA: Diagnosis not present

## 2019-03-11 DIAGNOSIS — Z87891 Personal history of nicotine dependence: Secondary | ICD-10-CM | POA: Insufficient documentation

## 2019-03-11 DIAGNOSIS — K219 Gastro-esophageal reflux disease without esophagitis: Secondary | ICD-10-CM | POA: Diagnosis not present

## 2019-03-11 DIAGNOSIS — Z79899 Other long term (current) drug therapy: Secondary | ICD-10-CM | POA: Diagnosis not present

## 2019-03-11 DIAGNOSIS — Z888 Allergy status to other drugs, medicaments and biological substances status: Secondary | ICD-10-CM | POA: Insufficient documentation

## 2019-03-11 DIAGNOSIS — F1011 Alcohol abuse, in remission: Secondary | ICD-10-CM | POA: Diagnosis not present

## 2019-03-11 DIAGNOSIS — C109 Malignant neoplasm of oropharynx, unspecified: Secondary | ICD-10-CM | POA: Diagnosis not present

## 2019-03-14 ENCOUNTER — Ambulatory Visit
Admission: RE | Admit: 2019-03-14 | Discharge: 2019-03-14 | Disposition: A | Discharge status: Home / Self Care | Payer: Medicaid Other | Source: Ambulatory Visit | Attending: Radiation Oncology | Admitting: Radiation Oncology

## 2019-03-14 ENCOUNTER — Other Ambulatory Visit: Payer: Self-pay

## 2019-03-14 DIAGNOSIS — C109 Malignant neoplasm of oropharynx, unspecified: Secondary | ICD-10-CM | POA: Diagnosis not present

## 2019-03-15 ENCOUNTER — Ambulatory Visit: Payer: Medicaid Other

## 2019-03-15 ENCOUNTER — Ambulatory Visit
Admission: RE | Admit: 2019-03-15 | Discharge: 2019-03-15 | Disposition: A | Discharge status: Home / Self Care | Payer: Medicaid Other | Source: Ambulatory Visit | Attending: Radiation Oncology | Admitting: Radiation Oncology

## 2019-03-15 ENCOUNTER — Other Ambulatory Visit: Payer: Self-pay

## 2019-03-15 DIAGNOSIS — C109 Malignant neoplasm of oropharynx, unspecified: Secondary | ICD-10-CM | POA: Diagnosis not present

## 2019-03-16 ENCOUNTER — Other Ambulatory Visit: Payer: Self-pay

## 2019-03-16 ENCOUNTER — Ambulatory Visit: Payer: Medicaid Other

## 2019-03-16 ENCOUNTER — Ambulatory Visit
Admission: RE | Admit: 2019-03-16 | Discharge: 2019-03-16 | Disposition: A | Discharge status: Home / Self Care | Payer: Medicaid Other | Source: Ambulatory Visit | Attending: Radiation Oncology | Admitting: Radiation Oncology

## 2019-03-16 DIAGNOSIS — C109 Malignant neoplasm of oropharynx, unspecified: Secondary | ICD-10-CM | POA: Diagnosis not present

## 2019-03-17 ENCOUNTER — Other Ambulatory Visit: Payer: Self-pay

## 2019-03-17 ENCOUNTER — Ambulatory Visit
Admission: RE | Admit: 2019-03-17 | Discharge: 2019-03-17 | Disposition: A | Discharge status: Home / Self Care | Payer: Medicaid Other | Source: Ambulatory Visit | Attending: Radiation Oncology | Admitting: Radiation Oncology

## 2019-03-17 DIAGNOSIS — C109 Malignant neoplasm of oropharynx, unspecified: Secondary | ICD-10-CM | POA: Diagnosis not present

## 2019-03-18 ENCOUNTER — Ambulatory Visit: Payer: Medicaid Other

## 2019-03-18 ENCOUNTER — Other Ambulatory Visit: Payer: Self-pay

## 2019-03-18 ENCOUNTER — Ambulatory Visit
Admission: RE | Admit: 2019-03-18 | Discharge: 2019-03-18 | Disposition: A | Discharge status: Home / Self Care | Payer: Medicaid Other | Source: Ambulatory Visit | Attending: Radiation Oncology | Admitting: Radiation Oncology

## 2019-03-18 DIAGNOSIS — C109 Malignant neoplasm of oropharynx, unspecified: Secondary | ICD-10-CM | POA: Diagnosis not present

## 2019-03-21 ENCOUNTER — Ambulatory Visit
Admission: RE | Admit: 2019-03-21 | Discharge: 2019-03-21 | Disposition: A | Discharge status: Home / Self Care | Payer: Medicaid Other | Source: Ambulatory Visit | Attending: Radiation Oncology | Admitting: Radiation Oncology

## 2019-03-21 ENCOUNTER — Ambulatory Visit: Payer: Medicaid Other

## 2019-03-21 ENCOUNTER — Other Ambulatory Visit: Payer: Self-pay

## 2019-03-21 DIAGNOSIS — C109 Malignant neoplasm of oropharynx, unspecified: Secondary | ICD-10-CM | POA: Diagnosis not present

## 2019-03-22 ENCOUNTER — Ambulatory Visit: Payer: Medicaid Other

## 2019-03-23 ENCOUNTER — Ambulatory Visit: Payer: Medicaid Other

## 2019-04-18 ENCOUNTER — Other Ambulatory Visit: Payer: Self-pay

## 2019-04-18 ENCOUNTER — Encounter: Payer: Self-pay | Admitting: Radiation Oncology

## 2019-04-18 ENCOUNTER — Ambulatory Visit
Admission: RE | Admit: 2019-04-18 | Discharge: 2019-04-18 | Disposition: A | Discharge status: Home / Self Care | Payer: Medicaid Other | Source: Ambulatory Visit | Attending: Radiation Oncology | Admitting: Radiation Oncology

## 2019-04-18 ENCOUNTER — Other Ambulatory Visit: Payer: Self-pay | Admitting: *Deleted

## 2019-04-18 VITALS — BP 143/97 | HR 90 | Resp 18 | Wt 175.3 lb

## 2019-04-18 DIAGNOSIS — F17210 Nicotine dependence, cigarettes, uncomplicated: Secondary | ICD-10-CM | POA: Insufficient documentation

## 2019-04-18 DIAGNOSIS — Z923 Personal history of irradiation: Secondary | ICD-10-CM | POA: Insufficient documentation

## 2019-04-18 DIAGNOSIS — C01 Malignant neoplasm of base of tongue: Secondary | ICD-10-CM | POA: Insufficient documentation

## 2019-04-18 DIAGNOSIS — C109 Malignant neoplasm of oropharynx, unspecified: Secondary | ICD-10-CM

## 2019-04-18 NOTE — Progress Notes (Signed)
Radiation Oncology Follow up Note  Name: Carl Morton   Date:   04/18/2019 MRN:  [id number] DOB: 1971/10/07    This 48 y.o. male presents to the clinic today for 1 month follow-up status post  radiation therapy for T1N1 squamous cell carcinoma of the left base of tongue status post tors partial glossectomy and lymph node dissection.  REFERRING PROVIDER: Remi Haggard, FNP  HPI: Patient is a 48 year old male now at 1 month having completed radiation therapy.  For a T1N1 squamous cell carcinoma left base of tongue status post tors partial glossectomy and lymph node dissection.  He is seen today in routine follow-up and is doing well.  He specifically not had a neck pain dysphasia he is eating well taste has returned.  He has no significant xerostomia.  COMPLICATIONS OF TREATMENT: none  FOLLOW UP COMPLIANCE: keeps appointments   PHYSICAL EXAM:  BP (!) 143/97 (BP Location: Left Arm, Patient Position: Sitting)   Pulse 90   Resp 18   Wt 175 lb 4.3 oz (79.5 kg)   BMI 25.15 kg/m  Oral cavity is clear no oral mucosal lesions are identified neck is clear without evidence of cervical or supraclavicular adenopathy.  Well-developed well-nourished patient in NAD. HEENT reveals PERLA, EOMI, discs not visualized.  Oral cavity is clear. No oral mucosal lesions are identified. Neck is clear without evidence of cervical or supraclavicular adenopathy. Lungs are clear to A&P. Cardiac examination is essentially unremarkable with regular rate and rhythm without murmur rub or thrill. Abdomen is benign with no organomegaly or masses noted. Motor sensory and DTR levels are equal and symmetric in the upper and lower extremities. Cranial nerves II through XII are grossly intact. Proprioception is intact. No peripheral adenopathy or edema is identified. No motor or sensory levels are noted. Crude visual fields are within normal range.  RADIOLOGY RESULTS: No current films for review  PLAN: Present time patient is  doing well 1 month out from adjuvant radiation therapy for base of tongue cancer.  I am pleased with his overall progress.  He continues to improve very little side effect profile at this point time.  I have asked to see him back in 4 to 5 months for follow-up.  He continues follow-up care with ENT.  Patient knows to call with any concerns.  I would like to take this opportunity to thank you for allowing me to participate in the care of your patient.Noreene Filbert, MD

## 2019-08-10 ENCOUNTER — Ambulatory Visit: Admission: RE | Admit: 2019-08-10 | Payer: Medicaid Other | Source: Ambulatory Visit

## 2019-08-13 ENCOUNTER — Emergency Department: Payer: Medicaid Other

## 2019-08-13 ENCOUNTER — Emergency Department
Admission: EM | Admit: 2019-08-13 | Discharge: 2019-08-13 | Disposition: A | Discharge status: Home / Self Care | Payer: Medicaid Other | Attending: Emergency Medicine | Admitting: Emergency Medicine

## 2019-08-13 ENCOUNTER — Other Ambulatory Visit: Payer: Self-pay

## 2019-08-13 ENCOUNTER — Encounter: Payer: Self-pay | Admitting: Emergency Medicine

## 2019-08-13 DIAGNOSIS — Z859 Personal history of malignant neoplasm, unspecified: Secondary | ICD-10-CM | POA: Insufficient documentation

## 2019-08-13 DIAGNOSIS — Z79899 Other long term (current) drug therapy: Secondary | ICD-10-CM | POA: Insufficient documentation

## 2019-08-13 DIAGNOSIS — M79605 Pain in left leg: Secondary | ICD-10-CM | POA: Insufficient documentation

## 2019-08-13 DIAGNOSIS — Z96641 Presence of right artificial hip joint: Secondary | ICD-10-CM | POA: Insufficient documentation

## 2019-08-13 DIAGNOSIS — F17210 Nicotine dependence, cigarettes, uncomplicated: Secondary | ICD-10-CM | POA: Insufficient documentation

## 2019-08-13 MED ORDER — CYCLOBENZAPRINE HCL 10 MG PO TABS: 10.0000 mg | ORAL_TABLET | Freq: Three times a day (TID) | ORAL | 0 refills | Status: DC | PRN

## 2019-08-13 MED ORDER — TRAMADOL HCL 50 MG PO TABS: 50.0000 mg | ORAL_TABLET | Freq: Four times a day (QID) | ORAL | 0 refills | Status: AC | PRN

## 2019-08-13 MED ORDER — IBUPROFEN 600 MG PO TABS: 600.0000 mg | ORAL_TABLET | Freq: Three times a day (TID) | ORAL | 0 refills | Status: DC | PRN

## 2019-08-13 NOTE — ED Notes (Signed)
First Nurse Note: Pt c/o left leg pain x 1 week, worse over the last 5-6 days. Pt was ambulatory into ED with cane.

## 2019-08-13 NOTE — ED Notes (Signed)
Pt c/o left leg pain x a few weeks. Pt denies any hx of gout.

## 2019-08-13 NOTE — Discharge Instructions (Signed)
Advised to follow-up with orthopedic department if no improvement in 2 days.

## 2019-08-13 NOTE — ED Notes (Signed)
Pt no longer in waiting area and near by patients stated he left to go home

## 2019-08-13 NOTE — ED Triage Notes (Signed)
L upper leg pain x 1 week. Denies fall or injury. Denies wound.

## 2019-08-13 NOTE — ED Provider Notes (Signed)
[facility] Emergency Department Provider Note   ____________________________________________   First MD Initiated Contact with Patient 08/13/19 1126     (approximate)  I have reviewed the triage vital signs and the nursing notes.   HISTORY  Chief Complaint Leg Pain    HPI Carl Morton is a 48 y.o. male patient complaining of left upper leg pain for 1 week.  Patient denies provocative or complaint.  Patient state he started a new job which requires prolonged walking.  Patient has a history of right hip replacement.  Patient state he now has to walk with assistance of a cane.  Patient rates pain 6/10.  Patient described the pain is "achy".  No palliative measure for complaint.         Past Medical History:  Diagnosis Date  . Allergy   . Anxiety   . Cancer ([place])   . Chlamydia   . GERD (gastroesophageal reflux disease)   . Hepatitis C   . History of alcohol abuse   . History of drug abuse ([place])   . History of hemorrhoids   . Pneumonia    h/o  . Seizure ([place]) 2008   due to alcohol withdrawal    Patient Active Problem List   Diagnosis Date Noted  . Screen for STD (sexually transmitted disease) 02/06/2014  . H/O ETOH abuse 02/06/2014    Past Surgical History:  Procedure Laterality Date  . ANKLE ARTHROSCOPY W/ OPEN REPAIR  2012  . HEMORRHOID SURGERY    . JOINT REPLACEMENT     hip  . MICROLARYNGOSCOPY N/A 09/09/2018   Procedure: MICROLARYNGOSCOPY;  Surgeon: Carloyn Manner, MD;  Location: [facility] ORS;  Service: ENT;  Laterality: N/A;  . TEAR DUCT RECONSTRUCTION Bilateral    Adolescent  . TONGUE BIOPSY Left 09/09/2018   Procedure: TONGUE BIOPSY;  Surgeon: Carloyn Manner, MD;  Location: [facility] ORS;  Service: ENT;  Laterality: Left;    Prior to Admission medications   Medication Sig Start Date End Date Taking? Authorizing Provider  baclofen (LIORESAL) 10 MG tablet Take 10 mg by mouth as needed for muscle spasms.    [provider]  cyclobenzaprine (FLEXERIL) 10 MG tablet Take 1 tablet (10 mg total) by mouth 3 (three) times daily as needed. 08/13/19   Sable Feil, PA-C  diphenhydrAMINE (BENADRYL ALLERGY) 25 MG tablet Take 200 mg by mouth as needed (pt takes 8 25 mg tab prn at bedtime).    [provider]  HYDROcodone-acetaminophen (NORCO) 5-325 MG tablet Take 1 tablet by mouth every 4 (four) hours as needed for moderate pain. 09/09/18   Carloyn Manner, MD  ibuprofen (ADVIL) 600 MG tablet Take 1 tablet (600 mg total) by mouth every 8 (eight) hours as needed. 08/13/19   Sable Feil, PA-C  lidocaine (XYLOCAINE) 2 % solution Use as directed 10 mLs in the mouth or throat every 6 (six) hours as needed for mouth pain (Swish and spit). 09/09/18   Carloyn Manner, MD  loratadine (CLARITIN) 10 MG tablet Take 10 mg by mouth every morning.    [provider]  Melatonin 10 MG TABS Take 1 tablet by mouth as needed.    [provider]  naproxen (NAPROSYN) 500 MG tablet Take 500 mg by mouth as needed.    [provider]  omeprazole (PRILOSEC) 40 MG capsule Take 40 mg by mouth every morning.    [provider]  ondansetron (ZOFRAN) 4 MG tablet Take 1 tablet (4 mg  total) by mouth every 8 (eight) hours as needed for up to 10 doses for nausea or vomiting. 09/09/18   Carloyn Manner, MD  sildenafil (REVATIO) 20 MG tablet Take 20 mg by mouth as needed.    [provider]  sucralfate (CARAFATE) 1 g tablet Take 1 tablet (1 g total) by mouth 3 (three) times daily before meals. Dissolve in warm water swish and swallow Patient not taking: Reported on 04/18/2019 02/22/19   Noreene Filbert, MD  traMADol (ULTRAM) 50 MG tablet Take 1 tablet (50 mg total) by mouth every 6 (six) hours as needed for up to 3 days. 08/13/19 08/16/19  Sable Feil, PA-C  TraZODone HCl 150 MG TB24 Take 1 tablet by mouth as needed.    [provider]    Allergies Haldol [haloperidol lactate]  Family  History  Problem Relation Age of Onset  . Heart disease Father        CAD    Social History Social History   Tobacco Use  . Smoking status: Current Every Day Smoker    Packs/day: 0.25    Years: 34.00    Pack years: 8.50    Types: Cigarettes  . Smokeless tobacco: Never Used  . Tobacco comment: 34 yrs   Substance Use Topics  . Alcohol use: No    Comment: clean for 9 years  . Drug use: Not Currently    Types: Heroin, "Crack" cocaine, Cocaine, Marijuana    Comment: clean for 9 years    Review of Systems  Constitutional: No fever/chills Eyes: No visual changes. ENT: No sore throat. Cardiovascular: Denies chest pain. Respiratory: Denies shortness of breath. Gastrointestinal: No abdominal pain.  No nausea, no vomiting.  No diarrhea.  No constipation. Genitourinary: Negative for dysuria. Musculoskeletal: Left upper leg pain.   Skin: Negative for rash. Neurological: Negative for headaches, focal weakness or numbness. Psychiatric:  Anxiety Endocrine:  Hepatitis C Allergic/Immunilogical: Haldol. ____________________________________________   PHYSICAL EXAM:  VITAL SIGNS: ED Triage Vitals  Enc Vitals Group     BP 08/13/19 1049 135/71     Pulse Rate 08/13/19 1049 (!) 101     Resp 08/13/19 1049 20     Temp 08/13/19 1049 98.3 F (36.8 C)     Temp Source 08/13/19 1049 Oral     SpO2 08/13/19 1049 96 %     Weight 08/13/19 1050 165 lb (74.8 kg)     Height 08/13/19 1050 5\' 10"  (1.778 m)     Head Circumference --      Peak Flow --      Pain Score 08/13/19 1049 6     Pain Loc --      Pain Edu? --      Excl. in [place]? --    Constitutional: Alert and oriented. Well appearing and in no acute distress. Cardiovascular: Normal rate, regular rhythm. Grossly normal heart sounds.  Good peripheral circulation. Respiratory: Normal respiratory effort.  No retractions. Lungs CTAB. Musculoskeletal: No obvious deformity to the left lower extremity.  No leg length discrepancy.  Patient has  moderate guarding palpation mid anterior left thigh.  No lower extremity tenderness nor edema.  No joint effusions. Neurologic:  Normal speech and language. No gross focal neurologic deficits are appreciated. No gait instability. Skin:  Skin is warm, dry and intact. No rash noted. Psychiatric: Mood and affect are normal. Speech and behavior are normal.  ____________________________________________   LABS (all labs ordered are listed, but only abnormal results are displayed)  Labs Reviewed -  No data to display ____________________________________________  EKG   ____________________________________________  RADIOLOGY  ED MD interpretation:    Official radiology report(s): Dg Hip Unilat W Or Wo Pelvis 2-3 Views Left  Result Date: 08/13/2019 CLINICAL DATA:  Left hip pain for over 1 week, increased with ambulation. No reported injury. EXAM: DG HIP (WITH OR WITHOUT PELVIS) 2-3V LEFT COMPARISON:  12/21/2014 left hip radiographs FINDINGS: No left hip fracture or dislocation. No significant left hip arthropathy. No pelvic fracture or diastasis. No suspicious focal osseous lesions. Partially visualized fixation hardware in the proximal right femur. IMPRESSION: No acute osseous abnormality. No left hip malalignment or significant arthropathy. Electronically Signed   By: Ilona Sorrel M.D.   On: 08/13/2019 12:48    ____________________________________________   PROCEDURES  Procedure(s) performed (including Critical Care):  Procedures   ____________________________________________   INITIAL IMPRESSION / ASSESSMENT AND PLAN / ED COURSE  As part of my medical decision making, I reviewed the following data within the [place] was evaluated in Emergency Department on 08/13/2019 for the symptoms described in the history of present illness. He was evaluated in the context of the global COVID-19 pandemic, which necessitated consideration that the  patient might be at risk for infection with the SARS-CoV-2 virus that causes COVID-19. Institutional protocols and algorithms that pertain to the evaluation of patients at risk for COVID-19 are in a state of rapid change based on information released by regulatory bodies including the CDC and federal and state organizations. These policies and algorithms were followed during the patient's care in the ED.   1 week of nontraumatic left upper leg pain.  Patient relates started a new job which requires prolonged walking.  Discussed negative x-ray findings with patient.  Physical exam was benign.  Patient given discharge care instruction advised take medication as directed.  Patient vies [facility] complaints persist.      ____________________________________________   FINAL CLINICAL IMPRESSION(S) / ED DIAGNOSES  Final diagnoses:  Left leg pain     ED Discharge Orders         Ordered    traMADol (ULTRAM) 50 MG tablet  Every 6 hours PRN     08/13/19 1305    cyclobenzaprine (FLEXERIL) 10 MG tablet  3 times daily PRN     08/13/19 1305    ibuprofen (ADVIL) 600 MG tablet  Every 8 hours PRN     08/13/19 1305           Note:  This document was prepared using Dragon voice recognition software and may include unintentional dictation errors.    Sable Feil, PA-C 08/13/19 1309    Arta Silence, MD 08/13/19 [id number]

## 2019-08-17 ENCOUNTER — Ambulatory Visit: Payer: Medicaid Other | Attending: Radiation Oncology | Admitting: Radiation Oncology

## 2020-01-26 ENCOUNTER — Other Ambulatory Visit: Payer: Self-pay | Admitting: Radiation Oncology

## 2020-01-26 DIAGNOSIS — C109 Malignant neoplasm of oropharynx, unspecified: Secondary | ICD-10-CM

## 2020-03-22 ENCOUNTER — Emergency Department
Admission: EM | Admit: 2020-03-22 | Discharge: 2020-03-23 | Disposition: A | Discharge status: Home / Self Care | Payer: Medicaid Other | Attending: Emergency Medicine | Admitting: Emergency Medicine

## 2020-03-22 ENCOUNTER — Other Ambulatory Visit: Payer: Self-pay

## 2020-03-22 DIAGNOSIS — Z79899 Other long term (current) drug therapy: Secondary | ICD-10-CM | POA: Insufficient documentation

## 2020-03-22 DIAGNOSIS — F1020 Alcohol dependence, uncomplicated: Secondary | ICD-10-CM | POA: Insufficient documentation

## 2020-03-22 DIAGNOSIS — F10939 Alcohol use, unspecified with withdrawal, unspecified: Secondary | ICD-10-CM

## 2020-03-22 DIAGNOSIS — Z96649 Presence of unspecified artificial hip joint: Secondary | ICD-10-CM | POA: Insufficient documentation

## 2020-03-22 DIAGNOSIS — Z20822 Contact with and (suspected) exposure to covid-19: Secondary | ICD-10-CM | POA: Insufficient documentation

## 2020-03-22 DIAGNOSIS — F17210 Nicotine dependence, cigarettes, uncomplicated: Secondary | ICD-10-CM | POA: Insufficient documentation

## 2020-03-22 DIAGNOSIS — F1010 Alcohol abuse, uncomplicated: Secondary | ICD-10-CM | POA: Diagnosis present

## 2020-03-22 DIAGNOSIS — Z859 Personal history of malignant neoplasm, unspecified: Secondary | ICD-10-CM | POA: Diagnosis not present

## 2020-03-22 LAB — COMPREHENSIVE METABOLIC PANEL
ALT: 44 U/L (ref 0–44)
AST: 68 U/L — ABNORMAL HIGH (ref 15–41)
Albumin: 4.2 g/dL (ref 3.5–5.0)
Alkaline Phosphatase: 54 U/L (ref 38–126)
Anion gap: 9 (ref 5–15)
BUN: 7 mg/dL (ref 6–20)
CO2: 26 mmol/L (ref 22–32)
Calcium: 8.4 mg/dL — ABNORMAL LOW (ref 8.9–10.3)
Chloride: 101 mmol/L (ref 98–111)
Creatinine, Ser: 0.89 mg/dL (ref 0.61–1.24)
GFR calc Af Amer: >60 mL/min (ref >60)
GFR calc non Af Amer: >60 mL/min (ref >60)
Glucose, Bld: 96 mg/dL (ref 70–99)
Potassium: 4.1 mmol/L (ref 3.5–5.1)
Sodium: 136 mmol/L (ref 135–145)
Total Bilirubin: 0.7 mg/dL (ref 0.3–1.2)
Total Protein: 7.6 g/dL (ref 6.5–8.1)

## 2020-03-22 LAB — CBC
HCT: 42.6 % (ref 39.0–52.0)
Hemoglobin: 15.1 g/dL (ref 13.0–17.0)
MCH: 33.4 pg (ref 26.0–34.0)
MCHC: 35.4 g/dL (ref 30.0–36.0)
MCV: 94.2 fL (ref 80.0–100.0)
Platelets: 124 10*3/uL — ABNORMAL LOW (ref 150–400)
RBC: 4.52 MIL/uL (ref 4.22–5.81)
RDW: 14.7 % (ref 11.5–15.5)
WBC: 3 10*3/uL — ABNORMAL LOW (ref 4.0–10.5)
nRBC: 0 % (ref 0.0–0.2)

## 2020-03-22 LAB — ETHANOL: Alcohol, Ethyl (B): 254 mg/dL — ABNORMAL HIGH (ref <10)

## 2020-03-22 MED ORDER — SODIUM CHLORIDE 0.9 % IV BOLUS
1000.0000 mL | Freq: Once | INTRAVENOUS | Status: AC
  Administered 2020-03-22: 1000 mL via INTRAVENOUS

## 2020-03-22 MED ORDER — LORAZEPAM 2 MG/ML IJ SOLN: 0.0000 mg | Freq: Two times a day (BID) | INTRAMUSCULAR | Status: DC

## 2020-03-22 MED ORDER — LORAZEPAM 2 MG PO TABS: 0.0000 mg | ORAL_TABLET | Freq: Two times a day (BID) | ORAL | Status: DC

## 2020-03-22 MED ORDER — THIAMINE HCL 100 MG/ML IJ SOLN: 100.0000 mg | Freq: Every day | INTRAMUSCULAR | Status: DC

## 2020-03-22 MED ORDER — LORAZEPAM 2 MG/ML IJ SOLN
2.0000 mg | Freq: Once | INTRAMUSCULAR | Status: AC
  Administered 2020-03-22: 2 mg via INTRAVENOUS
  Filled 2020-03-22: qty 1

## 2020-03-22 MED ORDER — LORAZEPAM 2 MG/ML IJ SOLN
0.0000 mg | Freq: Four times a day (QID) | INTRAMUSCULAR | Status: DC
  Administered 2020-03-23: 2 mg via INTRAVENOUS
  Filled 2020-03-22: qty 1

## 2020-03-22 MED ORDER — LORAZEPAM 2 MG PO TABS
0.0000 mg | ORAL_TABLET | Freq: Four times a day (QID) | ORAL | Status: DC
  Administered 2020-03-23: 2 mg via ORAL
  Filled 2020-03-22: qty 1

## 2020-03-22 MED ORDER — ONDANSETRON HCL 4 MG/2ML IJ SOLN
4.0000 mg | Freq: Once | INTRAMUSCULAR | Status: AC
  Administered 2020-03-22: 4 mg via INTRAVENOUS
  Filled 2020-03-22: qty 2

## 2020-03-22 MED ORDER — THIAMINE HCL 100 MG PO TABS
100.0000 mg | ORAL_TABLET | Freq: Every day | ORAL | Status: DC
  Administered 2020-03-23: 100 mg via ORAL
  Filled 2020-03-22: qty 1

## 2020-03-22 NOTE — ED Notes (Signed)
See triage note, pt reports here for alcohol detox. Last drank at 1300. Reports drinking "too much". Tremors noted. Alert and oriented.  Ambulatory with cane.  Reports hx seizures.

## 2020-03-22 NOTE — ED Provider Notes (Signed)
[name] Emergency Department Provider Note ____________________________________________   First MD Initiated Contact with Patient 03/22/20 2111     (approximate)  I have reviewed the triage vital signs and the nursing notes.   HISTORY  Chief Complaint Alcohol Intoxication    HPI Carl Morton is a 49 y.o. male with PMH as noted below including a history of alcohol abuse who presents with concern for alcohol withdrawal, acute onset this evening.  The patient states that his last drink was around 1:30 PM today.  He states he normally drinks four or five 40 ounce beers daily.  He states he has been trying to stop.  He reports a previous history of alcohol withdrawal when he tried to stop several years ago.  The patient characterizes the symptoms as feeling shaky, anxious, and nauseous.  Past Medical History:  Diagnosis Date  . Allergy   . Anxiety   . Cancer ([place])   . Chlamydia   . GERD (gastroesophageal reflux disease)   . Hepatitis C   . History of alcohol abuse   . History of drug abuse ([place])   . History of hemorrhoids   . Pneumonia    h/o  . Seizure ([place]) 2008   due to alcohol withdrawal    Patient Active Problem List   Diagnosis Date Noted  . Screen for STD (sexually transmitted disease) 02/06/2014  . H/O ETOH abuse 02/06/2014    Past Surgical History:  Procedure Laterality Date  . ANKLE ARTHROSCOPY W/ OPEN REPAIR  2012  . HEMORRHOID SURGERY    . JOINT REPLACEMENT     hip  . MICROLARYNGOSCOPY N/A 09/09/2018   Procedure: MICROLARYNGOSCOPY;  Surgeon: Carloyn Manner, MD;  Location: [facility] ORS;  Service: ENT;  Laterality: N/A;  . TEAR DUCT RECONSTRUCTION Bilateral    Adolescent  . TONGUE BIOPSY Left 09/09/2018   Procedure: TONGUE BIOPSY;  Surgeon: Carloyn Manner, MD;  Location: [facility] ORS;  Service: ENT;  Laterality: Left;    Prior to Admission medications   Medication Sig Start Date End Date Taking? Authorizing Provider  baclofen  (LIORESAL) 10 MG tablet Take 10 mg by mouth as needed for muscle spasms.    [provider]  cyclobenzaprine (FLEXERIL) 10 MG tablet Take 1 tablet (10 mg total) by mouth 3 (three) times daily as needed. 08/13/19   Sable Feil, PA-C  diphenhydrAMINE (BENADRYL ALLERGY) 25 MG tablet Take 200 mg by mouth as needed (pt takes 8 25 mg tab prn at bedtime).    [provider]  HYDROcodone-acetaminophen (NORCO) 5-325 MG tablet Take 1 tablet by mouth every 4 (four) hours as needed for moderate pain. 09/09/18   Carloyn Manner, MD  ibuprofen (ADVIL) 600 MG tablet Take 1 tablet (600 mg total) by mouth every 8 (eight) hours as needed. 08/13/19   Sable Feil, PA-C  lidocaine (XYLOCAINE) 2 % solution Use as directed 10 mLs in the mouth or throat every 6 (six) hours as needed for mouth pain (Swish and spit). 09/09/18   Carloyn Manner, MD  loratadine (CLARITIN) 10 MG tablet Take 10 mg by mouth every morning.    [provider]  Melatonin 10 MG TABS Take 1 tablet by mouth as needed.    [provider]  naproxen (NAPROSYN) 500 MG tablet Take 500 mg by mouth as needed.    [provider]  omeprazole (PRILOSEC) 40 MG capsule Take 40 mg by mouth every morning.    [provider]  ondansetron (  ZOFRAN) 4 MG tablet Take 1 tablet (4 mg total) by mouth every 8 (eight) hours as needed for up to 10 doses for nausea or vomiting. 09/09/18   Carloyn Manner, MD  sildenafil (REVATIO) 20 MG tablet Take 20 mg by mouth as needed.    [provider]  sucralfate (CARAFATE) 1 g tablet Take 1 tablet (1 g total) by mouth 3 (three) times daily before meals. Dissolve in warm water swish and swallow Patient not taking: Reported on 04/18/2019 02/22/19   Noreene Filbert, MD  TraZODone HCl 150 MG TB24 Take 1 tablet by mouth as needed.    [provider]    Allergies Haldol [haloperidol lactate]  Family History  Problem Relation Age of Onset  . Heart disease  Father        CAD    Social History Social History   Tobacco Use  . Smoking status: Current Every Day Smoker    Packs/day: 0.25    Years: 34.00    Pack years: 8.50    Types: Cigarettes  . Smokeless tobacco: Never Used  . Tobacco comment: 34 yrs   Substance Use Topics  . Alcohol use: No    Comment: clean for 9 years  . Drug use: Not Currently    Types: Heroin, "Crack" cocaine, Cocaine, Marijuana    Comment: clean for 9 years    Review of Systems  Constitutional: No fever. Eyes: No redness. ENT: No sore throat. Cardiovascular: Denies chest pain. Respiratory: Denies shortness of breath. Gastrointestinal: Positive for nausea. Genitourinary: Negative for flank pain.  Musculoskeletal: Negative for back pain. Skin: Negative for rash. Neurological: Negative for headache.   ____________________________________________   PHYSICAL EXAM:  VITAL SIGNS: ED Triage Vitals  Enc Vitals Group     BP 03/22/20 1518 140/88     Pulse Rate 03/22/20 1518 91     Resp 03/22/20 1518 18     Temp 03/22/20 1518 98.3 F (36.8 C)     Temp Source 03/22/20 1518 Oral     SpO2 03/22/20 1518 98 %     Weight 03/22/20 1518 180 lb (81.6 kg)     Height 03/22/20 1518 5\' 10"  (1.778 m)     Head Circumference --      Peak Flow --      Pain Score 03/22/20 1517 6     Pain Loc --      Pain Edu? --      Excl. in [place]? --     Constitutional: Alert and oriented.  Somewhat anxious appearing but in no acute distress. Eyes: Conjunctivae are normal.  EOMI.  PERRLA. Head: Atraumatic. Nose: No congestion/rhinnorhea. Mouth/Throat: Mucous membranes are somewhat dry.  Mild tongue fasciculation. Neck: Normal range of motion.  Cardiovascular: Normal rate, regular rhythm.  Good peripheral circulation. Respiratory: Normal respiratory effort.  No retractions.  Gastrointestinal: No distention.  Musculoskeletal: No lower extremity edema.  Extremities warm and well perfused.  Neurologic:  Normal speech and  language.  Motor intact in all extremities.  Mild tremor bilaterally. Skin:  Skin is warm and dry. No rash noted. Psychiatric: Slightly anxious appearing.  ____________________________________________   LABS (all labs ordered are listed, but only abnormal results are displayed)  Labs Reviewed  COMPREHENSIVE METABOLIC PANEL - Abnormal; Notable for the following components:      Result Value   Calcium 8.4 (*)    AST 68 (*)    All other components within normal limits  ETHANOL - Abnormal; Notable for the following components:  Alcohol, Ethyl (B) 254 (*)    All other components within normal limits  CBC - Abnormal; Notable for the following components:   WBC 3.0 (*)    Platelets 124 (*)    All other components within normal limits  SARS CORONAVIRUS 2 BY RT PCR (HOSPITAL ORDER, [place] [facility])  URINE DRUG SCREEN, QUALITATIVE ([place])   ____________________________________________  EKG   ____________________________________________  RADIOLOGY    ____________________________________________   PROCEDURES  Procedure(s) performed: No  Procedures  Critical Care performed: No ____________________________________________   INITIAL IMPRESSION / ASSESSMENT AND PLAN / ED COURSE  Pertinent labs & imaging results that were available during my care of the patient were reviewed by me and considered in my medical decision making (see chart for details).  49 year old male with history of alcohol abuse presents with a concern for alcohol withdrawal.  He reports his last drink was earlier this afternoon and he is trying to stop.  He reports feeling anxious, shaky, and nauseated.  I reviewed the past medical records in [place].  The patient was seen once in 2020 for unrelated symptoms, and previously in 2016.  He has no recent ED visits or admissions for alcohol-related issues.  On exam, he is somewhat anxious and tired appearing but in no acute distress.  He  has mild hypertension with otherwise normal vital signs.  He does have some tongue fasciculation and tremor.  The neurologic exam is otherwise unremarkable.  Initial lab work-up reveals an alcohol level of 254.  However, the patient is likely demonstrating signs of early withdrawal.  We will give a dose of Ativan, fluids, and placed the patient on the CIWA protocol.  He is interested in detox screening, so I ordered a TTS consult.  ----------------------------------------- 12:13 AM on 03/23/2020 -----------------------------------------  TTS attempted evaluation of the patient, but he is too sleepy now.  His vital signs remained stable, and his blood pressure is improved.  I signed the patient out to the oncoming physician Dr. Karma Greaser.  ____________________________________________   FINAL CLINICAL IMPRESSION(S) / ED DIAGNOSES  Final diagnoses:  Alcohol abuse      NEW MEDICATIONS STARTED DURING THIS VISIT:  New Prescriptions   No medications on file     Note:  This document was prepared using Dragon voice recognition software and may include unintentional dictation errors.   Arta Silence, MD 03/23/20 [phone]

## 2020-03-22 NOTE — ED Notes (Signed)
Dr Cherylann Banas notified of CIWA 5, will assess

## 2020-03-22 NOTE — BH Assessment (Signed)
Patient unable to participate in assessment at this time due to inability to stay awake, will attempt at a later time.

## 2020-03-22 NOTE — ED Triage Notes (Addendum)
Pt here for etoh detox after 10 years of sobriety. Pt also c/o of bilateral feet swelling that started a couple of days ago. Pt's last drink was at 1330.

## 2020-03-23 LAB — SARS CORONAVIRUS 2 BY RT PCR (HOSPITAL ORDER, PERFORMED IN ~~LOC~~ HOSPITAL LAB): SARS Coronavirus 2: NEGATIVE

## 2020-03-23 MED ORDER — CHLORDIAZEPOXIDE HCL 25 MG PO CAPS: 25.0000 mg | ORAL_CAPSULE | Freq: Once | ORAL | Status: DC

## 2020-03-23 MED ORDER — ONDANSETRON HCL 4 MG/2ML IJ SOLN: 4.0000 mg | Freq: Once | INTRAMUSCULAR | Status: DC

## 2020-03-23 MED ORDER — CHLORDIAZEPOXIDE HCL 25 MG PO CAPS
ORAL_CAPSULE | ORAL | 0 refills | Status: AC
Start: 2020-03-23 — End: 2020-03-30

## 2020-03-23 MED ORDER — ONDANSETRON HCL 4 MG/2ML IJ SOLN
INTRAMUSCULAR | Status: AC
  Administered 2020-03-23: 4 mg via INTRAVENOUS
  Filled 2020-03-23: qty 2

## 2020-03-23 NOTE — ED Notes (Signed)
Patient got up to go to bathroom, but He went back to sleep, no signs of distress, will continue to monitor.

## 2020-03-23 NOTE — ED Notes (Signed)
Assisted pt with lowering Hob to position of comfort.

## 2020-03-23 NOTE — ED Notes (Signed)
Nurse removed saline-lock, applied pressure, and He states that he was going to apply pressure, but when nurse looked back arm was bleeding out, nurse cleaned arm, and as applying more pressure, Patient is shaky, states that he is going to try to find a ride home, He is upset that He can't get a bed at [name], Patient aware that He is being discharged and states " I guess I will go back home" Patient is compliant and no behavioral issues noted.

## 2020-03-23 NOTE — ED Notes (Signed)
Assisted pt with lowering HOB for position of comfort.

## 2020-03-23 NOTE — BH Assessment (Signed)
Writer followed up with [name] ([phone]) and they received the referral but they don't have any male beds.  Writer spoke with ED MD (Dr. Joni Fears), informed him [name] didn't have any beds but he can discharge and still follow up with them from home.  Writer spoke with the patient and informed him [name] didn't have any beds and that he will discharge but can follow up with them. Writer offered him the information for [name] but he said he didn't need it because he already had it.  Patient denies SI/HI and AV/H.

## 2020-03-23 NOTE — BH Assessment (Signed)
Assessment Note  Carl Morton is an 49 y.o. male presenting to [facility] ED voluntarily requesting detox treatment. Per triage note Pt here for etoh detox after 10 years of sobriety. Pt also c/o of bilateral feet swelling that started a couple of days ago. Pt's last drink was at 1330. During assessment patient appears alert and oriented x4, irritable and withdrawing. Patient reported "I drink from the time I get up to the time I sleep. Patient reported that he drinks "beer." Patient reported the amounts being "5-6 40oz beers." Patient last date of use being 03/22/20. Patient reported his age of first use being 12. Patient reported that he was sober for 10 years and relapsed. Patient appears to be withdrawing, symptoms include vomiting and nausea, hot and cold chills, irritability. Patient BAL is 254. Patient denies SI/HI/AH/VH and does not appear to be responding to any internal or external stimuli.  Patient is requesting inpatient detox and currently meets criteria, patient verbally consents for his information to [facility].   Diagnosis: Alcohol Use Disorder, Severe  Past Medical History:  Past Medical History:  Diagnosis Date  . Allergy   . Anxiety   . Cancer ([place])   . Chlamydia   . GERD (gastroesophageal reflux disease)   . Hepatitis C   . History of alcohol abuse   . History of drug abuse ([place])   . History of hemorrhoids   . Pneumonia    h/o  . Seizure ([place]) 2008   due to alcohol withdrawal    Past Surgical History:  Procedure Laterality Date  . ANKLE ARTHROSCOPY W/ OPEN REPAIR  2012  . HEMORRHOID SURGERY    . JOINT REPLACEMENT     hip  . MICROLARYNGOSCOPY N/A 09/09/2018   Procedure: MICROLARYNGOSCOPY;  Surgeon: Carloyn Manner, MD;  Location: [facility] ORS;  Service: ENT;  Laterality: N/A;  . TEAR DUCT RECONSTRUCTION Bilateral    Adolescent  . TONGUE BIOPSY Left 09/09/2018   Procedure: TONGUE BIOPSY;  Surgeon: Carloyn Manner, MD;  Location: [facility]  ORS;  Service: ENT;  Laterality: Left;    Family History:  Family History  Problem Relation Age of Onset  . Heart disease Father        CAD    Social History:  reports that he has been smoking cigarettes. He has a 8.50 pack-year smoking history. He has never used smokeless tobacco. He reports previous drug use. Drugs: Heroin, "Crack" cocaine, Cocaine, and Marijuana. He reports that he does not drink alcohol.  Additional Social History:  Alcohol / Drug Use Pain Medications: See MAR Prescriptions: See MAR Over the Counter: See MAR History of alcohol / drug use?: Yes Substance #1 Name of Substance 1: Alcohol  CIWA: CIWA-Ar BP: 121/78 Pulse Rate: 82 Nausea and Vomiting: no nausea and no vomiting Tactile Disturbances: none Tremor: three Auditory Disturbances: not present Paroxysmal Sweats: no sweat visible Visual Disturbances: very mild sensitivity Anxiety: no anxiety, at ease Headache, Fullness in Head: very mild Agitation: normal activity Orientation and Clouding of Sensorium: oriented and can do serial additions CIWA-Ar Total: 5 COWS:    Allergies:  Allergies  Allergen Reactions  . Haldol [Haloperidol Lactate] Other (See Comments)    Home Medications: (Not in a hospital admission)   OB/GYN Status:  No LMP for male patient.  General Assessment Data Location of Assessment: [facility] ED TTS Assessment: In system Is this a Tele or Face-to-Face Assessment?: Face-to-Face Is this an Initial Assessment or a Re-assessment for this encounter?: Initial  Assessment Patient Accompanied by:: N/A Language Other than English: No Living Arrangements: Other (Comment)(Private Residence) What gender do you identify as?: Male Marital status: Single Living Arrangements: Alone Can pt return to current living arrangement?: Yes Admission Status: Voluntary Is patient capable of signing voluntary admission?: Yes Referral Source: Self/Family/Friend Insurance type: Medicaid  Medical  Screening Exam ([place]) Medical Exam completed: Yes  Crisis Care Plan Living Arrangements: Alone Legal Guardian: Other:(Self) Name of Psychiatrist: None Name of Therapist: None  Education Status Is patient currently in school?: No Is the patient employed, unemployed or receiving disability?: Unemployed  Risk to self with the past 6 months Suicidal Ideation: No Has patient been a risk to self within the past 6 months prior to admission? : No Suicidal Intent: No Has patient had any suicidal intent within the past 6 months prior to admission? : No Is patient at risk for suicide?: No Suicidal Plan?: No Has patient had any suicidal plan within the past 6 months prior to admission? : No Access to Means: No What has been your use of drugs/alcohol within the last 12 months?: Alcohol Previous Attempts/Gestures: No How many times?: 0 Other Self Harm Risks: None Triggers for Past Attempts: None known Intentional Self Injurious Behavior: None Family Suicide History: No Recent stressful life event(s): Other (Comment)(Recent Relapse) Persecutory voices/beliefs?: No Depression: No Substance abuse history and/or treatment for substance abuse?: Yes Suicide prevention information given to non-admitted patients: Not applicable  Risk to Others within the past 6 months Homicidal Ideation: No Does patient have any lifetime risk of violence toward others beyond the six months prior to admission? : No Thoughts of Harm to Others: No Current Homicidal Intent: No Current Homicidal Plan: No Access to Homicidal Means: No History of harm to others?: No Assessment of Violence: None Noted Does patient have access to weapons?: No Criminal Charges Pending?: No Does patient have a court date: No Is patient on probation?: No  Psychosis Hallucinations: None noted Delusions: None noted  Mental Status Report Appearance/Hygiene: In scrubs Eye Contact: Poor Motor Activity: Freedom of  movement Speech: Logical/coherent Level of Consciousness: Alert Mood: Irritable Affect: Appropriate to circumstance Anxiety Level: None Thought Processes: Coherent Judgement: Unimpaired Orientation: Person, Place, Time, Situation, Appropriate for developmental age Obsessive Compulsive Thoughts/Behaviors: None  Cognitive Functioning Concentration: Normal Memory: Recent Intact, Remote Intact Is patient IDD: No Insight: Fair Impulse Control: Fair Appetite: Poor Have you had any weight changes? : No Change Sleep: Decreased Total Hours of Sleep: 0 Vegetative Symptoms: None  ADLScreening [facility] Assessment Services) Patient's cognitive ability adequate to safely complete daily activities?: Yes Patient able to express need for assistance with ADLs?: Yes Independently performs ADLs?: Yes (appropriate for developmental age)  Prior Inpatient Therapy Prior Inpatient Therapy: No  Prior Outpatient Therapy Prior Outpatient Therapy: No Does patient have an ACCT team?: No Does patient have Intensive In-House Services?  : No Does patient have [facility] services? : No Does patient have P4CC services?: No  ADL Screening (condition at time of admission) Patient's cognitive ability adequate to safely complete daily activities?: Yes Is the patient deaf or have difficulty hearing?: No Does the patient have difficulty seeing, even when wearing glasses/contacts?: No Does the patient have difficulty concentrating, remembering, or making decisions?: No Patient able to express need for assistance with ADLs?: Yes Does the patient have difficulty dressing or bathing?: No Independently performs ADLs?: Yes (appropriate for developmental age) Does the patient have difficulty walking or climbing stairs?: No Weakness of Legs: None Weakness of  Arms/Hands: None  Home Assistive Devices/Equipment Home Assistive Devices/Equipment: None  Therapy Consults (therapy consults require a physician order) PT  Evaluation Needed: No OT Evalulation Needed: No SLP Evaluation Needed: No Abuse/Neglect Assessment (Assessment to be complete while patient is alone) Abuse/Neglect Assessment Can Be Completed: Yes Physical Abuse: Denies Verbal Abuse: Denies Sexual Abuse: Denies Exploitation of patient/patient's resources: Denies Self-Neglect: Denies Values / Beliefs Cultural Requests During Hospitalization: None Spiritual Requests During Hospitalization: None Consults Spiritual Care Consult Needed: No Transition of Care Team Consult Needed: No Advance Directives (For Healthcare) Does Patient Have a Medical Advance Directive?: No          Disposition: Patient is requesting inpatient detox and currently meets criteria, patient verbally consents for his information to [facility] to [facility].  Disposition Initial Assessment Completed for this Encounter: Yes Patient referred to: [name]  On Site Evaluation by:   Reviewed with Physician:    Leonie Douglas MS [place] 03/23/2020 2:39 AM

## 2020-03-23 NOTE — ED Notes (Signed)
Patient wet himself gave patient a set of scrub bottoms at this time

## 2020-03-23 NOTE — ED Provider Notes (Signed)
-----------------------------------------   12:01 AM on 03/23/2020 -----------------------------------------  Assuming care from Dr. Cherylann Banas.  In short, Carl Morton is a 49 y.o. male with a chief complaint of intoxication, alcohol abuse, and wanting rehab assistance.  Refer to the original H&P for additional details.  The current plan of care is to have TTS evaluate the patient when he can stay awake to determine disposition plan.  At this time, he does not meet criteria for inpatient admission.  Currently he is sleeping and could not talk with TTS.    ----------------------------------------- 2:20 AM on 03/23/2020 -----------------------------------------  [name] with [facility]TS spoke with the patient and [name] does not currently have a bed but may have 1 in the morning.  The patient was also given outpatient resources but he wants to stay.  He is still having some nausea and retching but is not actively producing any emesis.  We will move him to [address] for the night for close observation with expectation of definitive disposition in the morning, preferably [name] or discharge home.  Transferring ED care to Dr. Guinevere Scarlet who is currently the A-side doctor.   Hinda Kehr, MD 03/23/20 0221

## 2020-03-23 NOTE — BH Assessment (Addendum)
Referral information for Inpatient Detox Treatment faxed to;   [facility] [phone]

## 2020-03-23 NOTE — ED Notes (Signed)
Patient Here for Detox Help/ TTS Consult completed/ Faxed Referral to [name] waiting on decision in am after overnight monitoring.

## 2020-03-23 NOTE — ED Notes (Signed)
Patient voiced understanding of discharge instructions, all belongings given back to Patient, Patient ambulated to front of hospital and states that He will call for a ride home.

## 2020-03-23 NOTE — ED Provider Notes (Signed)
Procedures     ----------------------------------------- 10:41 AM on 03/23/2020 -----------------------------------------  Patient calm comfortable, ambulatory, clinically sober.  Vital signs are normal.  Unfortunately [name] does not have any bed availability currently.  He will be discharged home with a prescription for Librium to already manage withdrawal symptoms at home until substance abuse treatment availability improves.  He has been given resources by [facility]TS.  Clinical impression:  Alcohol dependence without complication   Carrie Mew, MD 03/23/20 1041

## 2020-04-18 ENCOUNTER — Other Ambulatory Visit: Payer: Self-pay

## 2020-04-18 ENCOUNTER — Emergency Department
Admission: EM | Admit: 2020-04-18 | Discharge: 2020-04-18 | Disposition: A | Discharge status: Home / Self Care | Payer: Medicaid Other | Attending: Emergency Medicine | Admitting: Emergency Medicine

## 2020-04-18 ENCOUNTER — Encounter: Payer: Self-pay | Admitting: Intensive Care

## 2020-04-18 DIAGNOSIS — F17210 Nicotine dependence, cigarettes, uncomplicated: Secondary | ICD-10-CM | POA: Diagnosis not present

## 2020-04-18 DIAGNOSIS — Z760 Encounter for issue of repeat prescription: Secondary | ICD-10-CM | POA: Diagnosis not present

## 2020-04-18 DIAGNOSIS — Z79899 Other long term (current) drug therapy: Secondary | ICD-10-CM | POA: Insufficient documentation

## 2020-04-18 MED ORDER — CHLORDIAZEPOXIDE HCL 25 MG PO CAPS: ORAL_CAPSULE | ORAL | 0 refills | Status: AC

## 2020-04-18 NOTE — ED Notes (Signed)
See triage note states he is requesting a refill for his Librium 25 mg  States he was on it in the past

## 2020-04-18 NOTE — ED Provider Notes (Signed)
Emergency Department Provider Note  ____________________________________________  Time seen: Approximately 4:07 PM  I have reviewed the triage vital signs and the nursing notes.   HISTORY  Chief Complaint Medication Refill   Historian Patient     HPI Carl Morton is a 49 y.o. male presents to the emergency department for medication refill.  Patient is requesting a prescription for Librium which has been prescribed for him in the past.  He has no other concerns or medical complaints.   Past Medical History:  Diagnosis Date  . Allergy   . Anxiety   . Cancer ([place])   . Chlamydia   . GERD (gastroesophageal reflux disease)   . Hepatitis C   . History of alcohol abuse   . History of drug abuse ([place])   . History of hemorrhoids   . Pneumonia    h/o  . Seizure ([place]) 2008   due to alcohol withdrawal     Immunizations up to date:  Yes.     Past Medical History:  Diagnosis Date  . Allergy   . Anxiety   . Cancer ([place])   . Chlamydia   . GERD (gastroesophageal reflux disease)   . Hepatitis C   . History of alcohol abuse   . History of drug abuse ([place])   . History of hemorrhoids   . Pneumonia    h/o  . Seizure ([place]) 2008   due to alcohol withdrawal    Patient Active Problem List   Diagnosis Date Noted  . Screen for STD (sexually transmitted disease) 02/06/2014  . H/O ETOH abuse 02/06/2014    Past Surgical History:  Procedure Laterality Date  . ANKLE ARTHROSCOPY W/ OPEN REPAIR  2012  . HEMORRHOID SURGERY    . JOINT REPLACEMENT     hip  . MICROLARYNGOSCOPY N/A 09/09/2018   Procedure: MICROLARYNGOSCOPY;  Surgeon: Carloyn Manner, MD;  Location: [facility] ORS;  Service: ENT;  Laterality: N/A;  . TEAR DUCT RECONSTRUCTION Bilateral    Adolescent  . TONGUE BIOPSY Left 09/09/2018   Procedure: TONGUE BIOPSY;  Surgeon: Carloyn Manner, MD;  Location: [facility] ORS;  Service: ENT;  Laterality: Left;    Prior to Admission medications   Medication Sig Start Date End  Date Taking? Authorizing Provider  baclofen (LIORESAL) 10 MG tablet Take 10 mg by mouth as needed for muscle spasms.    [provider]  chlordiazePOXIDE (LIBRIUM) 25 MG capsule Take 2 capsules by mouth three times a day for two days. Take 1 capsule by mouth three times a day for two days. Then take 1 capsule three times a day for up to three days. 04/18/20   Lannie Fields, PA-C  diphenhydrAMINE (BENADRYL ALLERGY) 25 MG tablet Take 200 mg by mouth as needed (pt takes 8 25 mg tab prn at bedtime).    [provider]  loratadine (CLARITIN) 10 MG tablet Take 10 mg by mouth every morning.    [provider]  Melatonin 10 MG TABS Take 1 tablet by mouth as needed.    [provider]  naproxen (NAPROSYN) 500 MG tablet Take 500 mg by mouth as needed.    [provider]  omeprazole (PRILOSEC) 40 MG capsule Take 40 mg by mouth every morning.    [provider]  sildenafil (REVATIO) 20 MG tablet Take 20 mg by mouth as needed.    [provider]  TraZODone HCl 150 MG TB24 Take 1 tablet by mouth as needed.    [provider]    Allergies Haldol [haloperidol lactate]  Family History  Problem Relation Age of Onset  . Heart disease Father        CAD    Social History Social History   Tobacco Use  . Smoking status: Current Every Day Smoker    Packs/day: 0.25    Years: 34.00    Pack years: 8.50    Types: Cigarettes  . Smokeless tobacco: Never Used  . Tobacco comment: 34 yrs   Substance Use Topics  . Alcohol use: Yes    Comment: alcohol abuse  . Drug use: Yes    Types: Heroin, "Crack" cocaine, Cocaine, Marijuana     Review of Systems  Constitutional: No fever/chills Eyes:  No discharge ENT: No upper respiratory complaints. Respiratory: no cough. No SOB/ use of accessory muscles to breath Gastrointestinal:   No nausea, no vomiting.  No diarrhea.  No constipation. Musculoskeletal: Negative for musculoskeletal pain. Skin:  Negative for rash, abrasions, lacerations, ecchymosis.  ____________________________________________   PHYSICAL EXAM:  VITAL SIGNS: ED Triage Vitals  Enc Vitals Group     BP 04/18/20 1405 132/86     Pulse Rate 04/18/20 1405 89     Resp 04/18/20 1405 18     Temp 04/18/20 1405 98.3 F (36.8 C)     Temp Source 04/18/20 1405 Oral     SpO2 04/18/20 1405 97 %     Weight 04/18/20 1406 180 lb (81.6 kg)     Height 04/18/20 1406 5\' 10"  (1.778 m)     Head Circumference --      Peak Flow --      Pain Score 04/18/20 1406 0     Pain Loc --      Pain Edu? --      Excl. in [place]? --      Constitutional: Alert and oriented. Well appearing and in no acute distress. Eyes: Conjunctivae are normal. PERRL. EOMI. Head: Atraumatic. ENT:      Nose: No congestion/rhinnorhea.      Mouth/Throat: Mucous membranes are moist.  Neck: No stridor.  No cervical spine tenderness to palpation. Cardiovascular: Normal rate, regular rhythm. Normal S1 and S2.  Good peripheral circulation. Respiratory: Normal respiratory effort without tachypnea or retractions. Lungs CTAB. Good air entry to the bases with no decreased or absent breath sounds Gastrointestinal: Bowel sounds x 4 quadrants. Soft and nontender to palpation. No guarding or rigidity. No distention. Musculoskeletal: Full range of motion to all extremities. No obvious deformities noted Neurologic:  Normal for age. No gross focal neurologic deficits are appreciated.  Skin:  Skin is warm, dry and intact. No rash noted. Psychiatric: Mood and affect are normal for age. Speech and behavior are normal.   ____________________________________________   LABS (all labs ordered are listed, but only abnormal results are displayed)  Labs Reviewed - No data to display ____________________________________________  EKG   ____________________________________________  RADIOLOGY   No results  found.  ____________________________________________    PROCEDURES  Procedure(s) performed:     Procedures     Medications - No data to display   ____________________________________________   INITIAL IMPRESSION / ASSESSMENT AND PLAN / ED COURSE  Pertinent labs & imaging results that were available during my care of the patient were reviewed by me and considered in my medical decision making (see chart for details).      Assessment and plan Medication refill 49 year old male presents to the emergency department requesting a refill for Librium.  Request was granted.  All patient questions were answered.    ____________________________________________  FINAL CLINICAL IMPRESSION(S) / ED DIAGNOSES  Final diagnoses:  Medication refill      NEW MEDICATIONS STARTED DURING THIS VISIT:  ED Discharge Orders         Ordered    chlordiazePOXIDE (LIBRIUM) 25 MG capsule     04/18/20 1553              This chart was dictated using voice recognition software/Dragon. Despite best efforts to proofread, errors can occur which can change the meaning. Any change was purely unintentional.     Lannie Fields, PA-C 04/18/20 [place], [place], MD 04/18/20 2111

## 2020-04-18 NOTE — ED Triage Notes (Signed)
Patient reports he is here for medication refill

## 2020-08-25 IMAGING — PT NUCLEAR MEDICINE PET IMAGE INITIAL (PI) SKULL BASE TO THIGH
1 of 10 series · 3 of 16 positions shown, 4 images · non-contrast
Comparison: Neck CT 09/30/2018.

CLINICAL DATA: Initial treatment strategy for oropharyngeal
cancer. Status post tongue biopsy and micro laryngoscopy
09/09/2018. Underwent transoral excision demonstrating moderately
differentiated squamous cell carcinoma with 1 positive level 3 node.
Pre adjuvant radiation therapy.

EXAM:
NUCLEAR MEDICINE PET SKULL BASE TO THIGH
TECHNIQUE: 9.3 mCi F-18 FDG was injected intravenously. Full-ring PET imaging
was performed from the skull base to thigh after the radiotracer. CT
data was obtained and used for attenuation correction and anatomic
localization.
Fasting blood glucose: 89 mg/dl

[Series 3: ct wb 5.0 b30f · axial · 0.98mm/px · z∈[-1118,-134]mm · 3 of 329 slices shown, 4 images]
[im 1/329  soft-tissue]
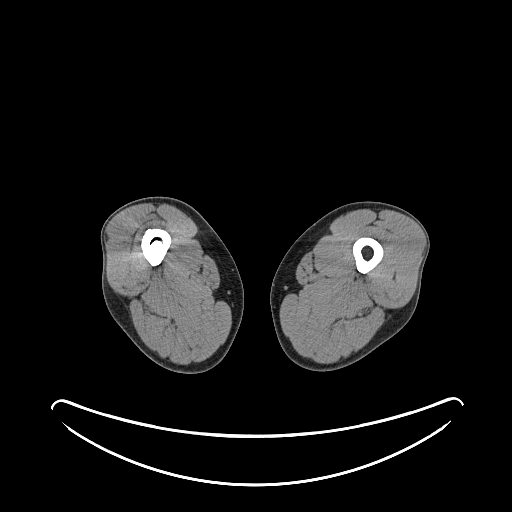
[im 1/329  bone]
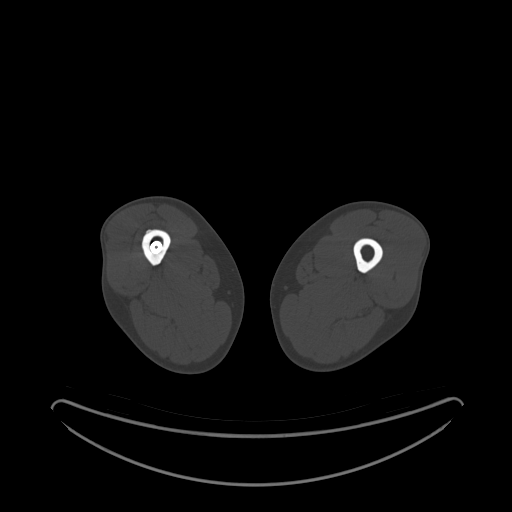
[im 165/329  soft-tissue]
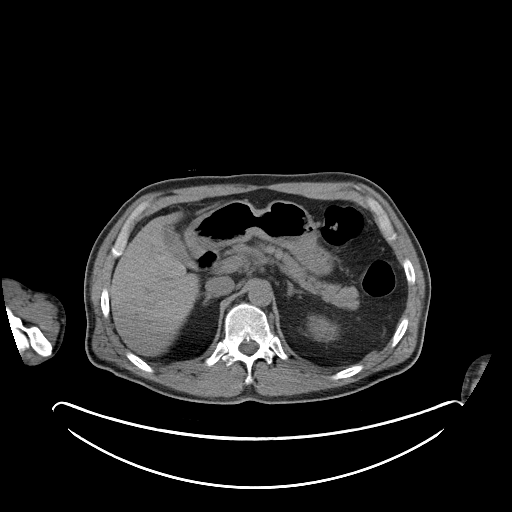
[im 329/329  soft-tissue]
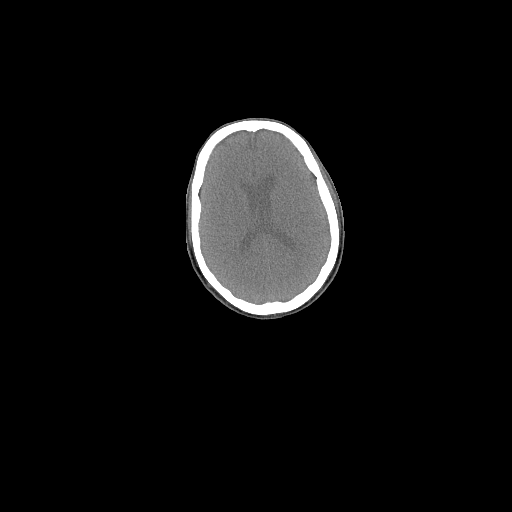

[3 of 16 positions shown; findings below may reference images not displayed]

FINDINGS: Mediastinal blood pool activity: SUV max

NECK: Surgical changes of the left side of the neck with resultant
ill definition of soft tissue planes. Low-level hypermetabolism
within is likely postoperative. There is an area of somewhat more
focal hypermetabolism position just lateral to the left hyoid bone
at a S.U.V. max of 2.7 on image 61/3. No well-defined adenopathy in
this area.

Incidental CT findings: Soft tissue calcifications about the left
side of the neck which are presumably postoperative.

CHEST: No pulmonary parenchymal or thoracic nodal hypermetabolism.

Incidental CT findings: Lad coronary artery calcification. Moderate
centrilobular emphysema.

ABDOMEN/PELVIS: No abdominopelvic parenchymal or nodal
hypermetabolism.

Incidental CT findings: Normal adrenal glands. Abdominal aortic
atherosclerosis. Tiny periumbilical fat containing ventral abdominal
wall hernia.

SKELETON: No abnormal marrow activity.

Incidental CT findings: Right proximal femur fixation. Convex right
lumbar spine curvature.
IMPRESSION: 1. Low-level hypermetabolism throughout the left side of the neck,
which is likely postoperative. An area of somewhat more focal
hypermetabolism within the deep portion of the surgical region is
without well-defined CT correlate and also likely postoperative.
2. No evidence of hypermetabolic extracervical disease.
3. Age advanced coronary artery atherosclerosis. Recommend
assessment of coronary risk factors and consideration of medical
therapy.
4. Aortic atherosclerosis (ELGPT-C3I.I) and emphysema (ELGPT-T7G.4).

## 2022-04-10 DEATH — deceased
